# Patient Record
Sex: Male | Born: 1950
Health system: Southern US, Community
[De-identification: ages and names within clinical notes are randomized; demographics above are authoritative.]

## PROBLEM LIST (undated history)

## (undated) ENCOUNTER — Emergency Department (HOSPITAL_COMMUNITY): Payer: Self-pay

## (undated) DIAGNOSIS — Z720 Tobacco use: Secondary | ICD-10-CM

## (undated) DIAGNOSIS — I10 Essential (primary) hypertension: Secondary | ICD-10-CM

## (undated) DIAGNOSIS — E785 Hyperlipidemia, unspecified: Secondary | ICD-10-CM

## (undated) DIAGNOSIS — I2510 Atherosclerotic heart disease of native coronary artery without angina pectoris: Secondary | ICD-10-CM

## (undated) DIAGNOSIS — I639 Cerebral infarction, unspecified: Secondary | ICD-10-CM

## (undated) HISTORY — DX: Tobacco use: Z72.0

## (undated) HISTORY — DX: Essential (primary) hypertension: I10

## (undated) HISTORY — PX: OTHER SURGICAL HISTORY: SHX169

## (undated) HISTORY — DX: Hyperlipidemia, unspecified: E78.5

## (undated) HISTORY — DX: Atherosclerotic heart disease of native coronary artery without angina pectoris: I25.10

## (undated) HISTORY — DX: Cerebral infarction, unspecified: I63.9

---

## 2001-03-18 ENCOUNTER — Emergency Department (HOSPITAL_COMMUNITY): Admission: AC | Admit: 2001-03-18 | Discharge: 2001-03-19 | Payer: Self-pay

## 2001-03-18 ENCOUNTER — Encounter: Payer: Self-pay | Admitting: General Surgery

## 2001-03-19 ENCOUNTER — Encounter: Payer: Self-pay | Admitting: General Surgery

## 2001-03-31 ENCOUNTER — Encounter: Payer: Self-pay | Admitting: Family Medicine

## 2001-03-31 ENCOUNTER — Encounter: Admission: RE | Admit: 2001-03-31 | Discharge: 2001-03-31 | Payer: Self-pay | Admitting: Family Medicine

## 2001-05-19 ENCOUNTER — Encounter: Payer: Self-pay | Admitting: Sports Medicine

## 2001-05-19 ENCOUNTER — Ambulatory Visit (HOSPITAL_COMMUNITY): Admission: RE | Admit: 2001-05-19 | Discharge: 2001-05-19 | Payer: Self-pay | Admitting: Sports Medicine

## 2001-11-01 ENCOUNTER — Encounter: Payer: Self-pay | Admitting: Emergency Medicine

## 2001-11-01 ENCOUNTER — Emergency Department (HOSPITAL_COMMUNITY): Admission: EM | Admit: 2001-11-01 | Discharge: 2001-11-01 | Payer: Self-pay | Admitting: Emergency Medicine

## 2009-10-24 ENCOUNTER — Emergency Department (HOSPITAL_COMMUNITY): Admission: EM | Admit: 2009-10-24 | Discharge: 2009-10-24 | Payer: Self-pay | Admitting: Family Medicine

## 2010-05-31 ENCOUNTER — Emergency Department (HOSPITAL_COMMUNITY)
Admission: EM | Admit: 2010-05-31 | Discharge: 2010-05-31 | Payer: Self-pay | Source: Home / Self Care | Admitting: Family Medicine

## 2010-06-04 LAB — GC/CHLAMYDIA PROBE AMP, GENITAL
Chlamydia, DNA Probe: NEGATIVE
GC Probe Amp, Genital: NEGATIVE

## 2011-01-30 ENCOUNTER — Emergency Department (HOSPITAL_COMMUNITY): Payer: 59

## 2011-01-30 ENCOUNTER — Inpatient Hospital Stay (INDEPENDENT_AMBULATORY_CARE_PROVIDER_SITE_OTHER)
Admission: RE | Admit: 2011-01-30 | Discharge: 2011-01-30 | Disposition: A | Discharge status: Home / Self Care | Payer: 59 | Source: Ambulatory Visit | Attending: Family Medicine | Admitting: Family Medicine

## 2011-01-30 ENCOUNTER — Inpatient Hospital Stay (HOSPITAL_COMMUNITY)
Admission: EM | Admit: 2011-01-30 | Discharge: 2011-02-01 | DRG: 282 | Disposition: A | Discharge status: Home / Self Care | Payer: Self-pay | Source: Ambulatory Visit | Attending: Cardiology | Admitting: Cardiology

## 2011-01-30 DIAGNOSIS — Z7902 Long term (current) use of antithrombotics/antiplatelets: Secondary | ICD-10-CM

## 2011-01-30 DIAGNOSIS — Z7982 Long term (current) use of aspirin: Secondary | ICD-10-CM

## 2011-01-30 DIAGNOSIS — F17200 Nicotine dependence, unspecified, uncomplicated: Secondary | ICD-10-CM | POA: Diagnosis present

## 2011-01-30 DIAGNOSIS — I2582 Chronic total occlusion of coronary artery: Secondary | ICD-10-CM | POA: Diagnosis present

## 2011-01-30 DIAGNOSIS — R7989 Other specified abnormal findings of blood chemistry: Secondary | ICD-10-CM | POA: Diagnosis present

## 2011-01-30 DIAGNOSIS — I2510 Atherosclerotic heart disease of native coronary artery without angina pectoris: Secondary | ICD-10-CM | POA: Diagnosis present

## 2011-01-30 DIAGNOSIS — I214 Non-ST elevation (NSTEMI) myocardial infarction: Principal | ICD-10-CM | POA: Diagnosis present

## 2011-01-30 DIAGNOSIS — R079 Chest pain, unspecified: Secondary | ICD-10-CM

## 2011-01-30 DIAGNOSIS — Z79899 Other long term (current) drug therapy: Secondary | ICD-10-CM

## 2011-01-30 DIAGNOSIS — E785 Hyperlipidemia, unspecified: Secondary | ICD-10-CM | POA: Diagnosis present

## 2011-01-30 LAB — CBC
HCT: 39.5 % (ref 39.0–52.0)
Hemoglobin: 14 g/dL (ref 13.0–17.0)
MCH: 32.7 pg (ref 26.0–34.0)
MCHC: 35.4 g/dL (ref 30.0–36.0)
MCV: 92.3 fL (ref 78.0–100.0)
Platelets: 231 10*3/uL (ref 150–400)
RBC: 4.28 MIL/uL (ref 4.22–5.81)
RDW: 13.8 % (ref 11.5–15.5)
WBC: 10.8 10*3/uL — ABNORMAL HIGH (ref 4.0–10.5)

## 2011-01-30 LAB — COMPREHENSIVE METABOLIC PANEL
ALT: 58 U/L — ABNORMAL HIGH (ref 0–53)
AST: 100 U/L — ABNORMAL HIGH (ref 0–37)
Albumin: 4 g/dL (ref 3.5–5.2)
Alkaline Phosphatase: 74 U/L (ref 39–117)
BUN: 7 mg/dL (ref 6–23)
CO2: 25 mEq/L (ref 19–32)
Calcium: 9.7 mg/dL (ref 8.4–10.5)
Chloride: 100 mEq/L (ref 96–112)
Creatinine, Ser: 0.86 mg/dL (ref 0.50–1.35)
GFR calc Af Amer: >60 mL/min (ref >60)
GFR calc non Af Amer: >60 mL/min (ref >60)
Glucose, Bld: 122 mg/dL — ABNORMAL HIGH (ref 70–99)
Potassium: 3.8 mEq/L (ref 3.5–5.1)
Sodium: 136 mEq/L (ref 135–145)
Total Bilirubin: 0.5 mg/dL (ref 0.3–1.2)
Total Protein: 7.6 g/dL (ref 6.0–8.3)

## 2011-01-30 LAB — DIFFERENTIAL
Basophils Absolute: 0 10*3/uL (ref 0.0–0.1)
Basophils Relative: 0 % (ref 0–1)
Eosinophils Absolute: 0.1 10*3/uL (ref 0.0–0.7)
Eosinophils Relative: 1 % (ref 0–5)
Lymphocytes Relative: 24 % (ref 12–46)
Lymphs Abs: 2.6 10*3/uL (ref 0.7–4.0)
Monocytes Absolute: 0.7 10*3/uL (ref 0.1–1.0)
Monocytes Relative: 7 % (ref 3–12)
Neutro Abs: 7.4 10*3/uL (ref 1.7–7.7)
Neutrophils Relative %: 68 % (ref 43–77)

## 2011-01-30 LAB — POCT I-STAT TROPONIN I: Troponin i, poc: 8.88 ng/mL (ref 0.00–0.08)

## 2011-01-31 DIAGNOSIS — I2510 Atherosclerotic heart disease of native coronary artery without angina pectoris: Secondary | ICD-10-CM

## 2011-01-31 DIAGNOSIS — I219 Acute myocardial infarction, unspecified: Secondary | ICD-10-CM

## 2011-01-31 HISTORY — PX: CARDIAC CATHETERIZATION: SHX172

## 2011-01-31 LAB — APTT: aPTT: 41 seconds — ABNORMAL HIGH (ref 24–37)

## 2011-01-31 LAB — PROTIME-INR
INR: 0.92 (ref 0.00–1.49)
Prothrombin Time: 12.6 seconds (ref 11.6–15.2)

## 2011-01-31 LAB — CBC
HCT: 35.8 % — ABNORMAL LOW (ref 39.0–52.0)
Hemoglobin: 12.1 g/dL — ABNORMAL LOW (ref 13.0–17.0)
MCH: 31.3 pg (ref 26.0–34.0)
MCHC: 33.8 g/dL (ref 30.0–36.0)
MCV: 92.7 fL (ref 78.0–100.0)
Platelets: 223 10*3/uL (ref 150–400)
RBC: 3.86 MIL/uL — ABNORMAL LOW (ref 4.22–5.81)
RDW: 14.2 % (ref 11.5–15.5)
WBC: 10.1 10*3/uL (ref 4.0–10.5)

## 2011-01-31 LAB — LIPID PANEL
Cholesterol: 213 mg/dL — ABNORMAL HIGH (ref 0–200)
HDL: 42 mg/dL (ref >39)
LDL Cholesterol: 111 mg/dL — ABNORMAL HIGH (ref 0–99)
Total CHOL/HDL Ratio: 5.1 RATIO
Triglycerides: 302 mg/dL — ABNORMAL HIGH (ref <150)
VLDL: 60 mg/dL — ABNORMAL HIGH (ref 0–40)

## 2011-01-31 LAB — HEPARIN LEVEL (UNFRACTIONATED): Heparin Unfractionated: 0.16 IU/mL — ABNORMAL LOW (ref 0.30–0.70)

## 2011-01-31 LAB — CK TOTAL AND CKMB (NOT AT ARMC)
CK, MB: 57.6 ng/mL (ref 0.3–4.0)
Relative Index: 9.3 — ABNORMAL HIGH (ref 0.0–2.5)
Total CK: 622 U/L — ABNORMAL HIGH (ref 7–232)

## 2011-01-31 LAB — CARDIAC PANEL(CRET KIN+CKTOT+MB+TROPI)
CK, MB: 34.4 ng/mL (ref 0.3–4.0)
CK, MB: 20.4 ng/mL (ref 0.3–4.0)
Relative Index: 7.1 — ABNORMAL HIGH (ref 0.0–2.5)
Total CK: 484 U/L — ABNORMAL HIGH (ref 7–232)
Total CK: 337 U/L — ABNORMAL HIGH (ref 7–232)
Troponin I: 10.83 ng/mL (ref <0.30)

## 2011-01-31 LAB — HEMOGLOBIN A1C
Hgb A1c MFr Bld: 5.1 % (ref <5.7)
Mean Plasma Glucose: 100 mg/dL (ref <117)

## 2011-01-31 LAB — MAGNESIUM: Magnesium: 2.2 mg/dL (ref 1.5–2.5)

## 2011-01-31 LAB — TSH: TSH: 0.412 u[IU]/mL (ref 0.350–4.500)

## 2011-01-31 LAB — RAPID URINE DRUG SCREEN, HOSP PERFORMED
Barbiturates: NOT DETECTED
Opiates: POSITIVE — AB
Tetrahydrocannabinol: NOT DETECTED

## 2011-01-31 LAB — TROPONIN I: Troponin I: 7.09 ng/mL (ref <0.30)

## 2011-01-31 LAB — MRSA PCR SCREENING: MRSA by PCR: NEGATIVE

## 2011-01-31 LAB — PRO B NATRIURETIC PEPTIDE: Pro B Natriuretic peptide (BNP): 207 pg/mL — ABNORMAL HIGH (ref 0–125)

## 2011-02-01 DIAGNOSIS — I214 Non-ST elevation (NSTEMI) myocardial infarction: Secondary | ICD-10-CM

## 2011-02-01 LAB — BASIC METABOLIC PANEL
CO2: 26 mEq/L (ref 19–32)
Glucose, Bld: 106 mg/dL — ABNORMAL HIGH (ref 70–99)
Potassium: 3.7 mEq/L (ref 3.5–5.1)
Sodium: 134 mEq/L — ABNORMAL LOW (ref 135–145)

## 2011-02-01 LAB — CBC
Hemoglobin: 12.2 g/dL — ABNORMAL LOW (ref 13.0–17.0)
RBC: 3.84 MIL/uL — ABNORMAL LOW (ref 4.22–5.81)

## 2011-02-02 NOTE — H&P (Signed)
[name], [name] NO.:  [id number]  MEDICAL RECORD NO.:  [id number]  LOCATION:  2908                         FACILITY:  [facility]  PHYSICIAN:  Lenon Oms, MD  DATE OF BIRTH:  09-07-1950  DATE OF ADMISSION:  01/30/2011 DATE OF DISCHARGE:                             HISTORY & PHYSICAL   CARDIOLOGIST:  None.  PRIMARY CARE PHYSICIAN:  None.  CHIEF COMPLAINT:  Chest pain.  HISTORY OF PRESENT ILLNESS:  Dennis Harmon is a 60 year old gentleman with no prior history of MI and no known coronary artery disease who presents to the emergency department with chief complaint of chest pain. The patient stated that his chest pain began 1 day prior, it was sudden onset and midsternal chest pain, rating 10/10.  The patient stated his chest pain started at around 5:00 p.m.  The patient took some Tylenol and did have some relief where his chest pain went down to about 5/10. The patient went to sleep, next day the patient awoke still with chest pain.  He also reported his chest pain radiated to his left arm and was also associated with diaphoresis.  The patient did feel lethargic and weak and did not go to work.  Today, he presented to urgent care facility who sent the patient here for further evaluation and management.  The patient was given three nitroglycerin and is currently chest pain free upon the evaluation here in the emergency department.  PAST MEDICAL HISTORY:  The patient denied any history of hypertension, diabetes, high cholesterol.  The patient has not had no prior cardiac stress test or cardiac catheterization.  He has only had surgery for a right thumb ligament.  ALLERGIES:  No known drug allergies.  MEDICATIONS:  He is not currently on any prescribed or over-the-counter medications.  SOCIAL HISTORY:  The patient lives in [place] with his mother.  He is a Naval architect.  He does smoke one and a half packs per day for the last 30 years.  He  drinks approximately 2 beers a day.  He denies any illicit drug use.  FAMILY HISTORY:  He stated that he had uncle with CVA and coronary artery disease.  REVIEW OF SYSTEMS:  Recently the patient reported chills, nonproductive cough.  He denied any melena.  All other systems were reviewed and are otherwise negative.  PHYSICAL EXAMINATION:  VITAL SIGNS:  Temperature 98.7, pulse 94, respiratory rate 22, blood pressure 114/64. GENERAL:  He is pleasant African American gentleman, in no acute distress. HEENT:  Normocephalic, atraumatic.  Pupils equally round and reactive to light.  Extraocular movements intact.  Anicteric sclerae. NECK:  Supple.  No JVD. HEART:  Regular rate and rhythm.  Normal S1 and S2. LUNGS:  Clear to auscultation bilaterally. ABDOMEN:  Soft and nontender.  Normoactive bowel sounds. NEUROLOGIC:  The patient was awake, alert, and oriented x3. EXTREMITIES:  No cyanosis, clubbing, or edema.  RADIOLOGY: 1. The patient had a chest x-ray which revealed no acute     cardiopulmonary abnormality. 2. The patient had an EKG and initially revealed sinus rhythm, heart     rate of 94 with PVCs.  There are nonspecific  T-wave changes.     Followup EKG, sinus rhythm, heart rate of 93, again with     nonspecific T-wave changes.  The patient has no prior EKGs in our     system for review or comparison.  LABORATORY DATA:  The patient had a CBC profile which revealed a white count of 10.8, hemoglobin of 14.0, hematocrit of 39.5, platelets of 231. The patient had a comprehensive metabolic profile which revealed a sodium of 138, potassium of 3.8, BUN of 7, creatinine of 0.8, AST was 100, ALT was 58, alk phos was 74.  The patient had a magnesium of 2.2. A proBNP was 207.  INR was 0.92.  Initial troponin was 8.88, followup troponin was 7.09.  ASSESSMENT:  Dennis Harmon is a 60 year old gentleman with past medical history of tobacco abuse, however, denies any prior history  of myocardial infarction or known coronary artery disease who presents to emergency department here with chief complaint of chest pain x2 days with troponin elevation of 8.88 with nonspecific T-wave changes on EKG. The patient will be admitted to [facility] with non-ST-segment elevation myocardial infarction.  The patient will be admitted to the intensive care unit.  We will continue to monitor serial biomarkers.  We will repeat EKG in a.m. or p.r.n. for any chest pain.  We will make n.p.o. at midnight.  The patient will likely benefit from left heart catheterization and possible percutaneous coronary intervention and medical management.  The patient has received aspirin and Plavix while in emergency department.  We will continue with aspirin, Plavix, also heparin drip, beta-blocker, and statin, and nitroglycerin p.r.n.  We will check a lipid profile, hemoglobin A1c, and TSH.  I discussed initial workup thus far with the patient in detail and also discussed plan.  I have answered all his questions at this time.          ______________________________ Lenon Oms, MD     PB/MEDQ  D:  01/31/2011  T:  01/31/2011  Job:  [id number]  Electronically Signed by Lenon Oms MD on 02/02/2011 05:59:19 PM

## 2011-02-03 NOTE — Discharge Summary (Addendum)
NAMEKAILAND, Harmon NO.:  [id number]  MEDICAL RECORD NO.:  [id number]  LOCATION:  2501                         FACILITY:  [facility]  PHYSICIAN:  Marca Ancona, MD      DATE OF BIRTH:  05-28-1950  DATE OF ADMISSION:  01/30/2011 DATE OF DISCHARGE:  02/01/2011                              DISCHARGE SUMMARY   DISCHARGE DIAGNOSES: 1. Non-ST-elevation myocardial infarction. 2. Newly diagnosed coronary artery disease by catheterization on     January 31, 2011, with subtotally occluded second obtuse marginal     and diffuse segmental plaque involving the mid circumflex and right     coronary artery, for medical therapy and deferments of percutaneous     coronary intervention for recurrent ischemia on medical regimen. 3. Normal left ventricular function with the ejection fraction of 55%     by echocardiogram on January 31, 2011. 4. Mildly elevated LFTs with AST of 100, ALT of 58 with regular     alcohol use, instructed to discontinue alcohol. 5. Dyslipidemia.  Total cholesterol 213, triglycerides 302, HDL 42,     LDL 111. 6. Tobacco abuse.  HOSPITAL COURSE:  [name] is a 60 year old gentleman with no prior pertinent past medical history, presented to the ED with complaints of chest pain, that began 1 day prior to admission.  He took some Tylenol and did have relief and his chest pain went down to a 5/10.  However, the patient awoke the following morning still with chest pain radiating to his left arm associated with diaphoresis.  He presented to urgent care and was subsequently promptly referred to the [facility] ER.  Upon evaluation by Cardiology, he was chest pain free.  EKG demonstrated sinus rhythm, heart rate of 94.  PVCs with nonspecific T-wave changes. He was admitted to the hospital for rule out and initial cardiac markers did demonstrate that he was having NSTEMI with a peak troponin of 10.83. He was started on aspirin, beta-blocker, and statin and  referred for catheterization which was performed on January 31, 2011, by Dr. Riley Kill.  Dr. Riley Kill found a subtotally occluded second OM branch with TIMI 1 flow.  Given his enzymes and history of onset of pain, he was felt to be more than likely well out to be on 24 hours, was likely a total occlusion of that OM.  Dr. Riley Kill recommended to defer PCI for recurrent ischemia on his medication regimen.  He was instructed to discontinue smoking.  His LFTs were noted to be mildly elevated on admission and Dr. Shirlee Latch has also instructed him to cut down on his alcohol use.  This will need to be rechecked as an outpatient.  On the day of discharge, the patient is feeling well.  He does have a low-grade temperature of 100 without obvious source of infection or complaints. Dr. Shirlee Latch has seen and examined and feels he is stable for discharge.  DISCHARGE LABORATORY DATA:  WBC 10, hemoglobin 12.2, hematocrit 35.7, platelet count 214.  Sodium 136, potassium 2.8, chloride 100, CO2 of 25, glucose 122, BUN 7, creatinine 0.86.  Total bilirubin 0.5, alk phos 74, AST 100, ALT 58, magnesium 2.2.  A1c 5.1.  Peak troponin 10.83. Cholesterol panel as above.  TSH 0.412.  STUDIES: 1. Chest x-ray on January 30, 2011, shows mildly low lung volumes     without acute cardiopulmonary abnormality. 2. Cardiac catheterization on January 31, 2011, please see above for     summary as well as full report for details. 3. A 2-D echocardiogram on January 31, 2011, demonstrated mild LVH.     EF 55%.  Basal mid anterolateral mild hypokinesis.  Grade 1     diastolic dysfunction.  PA systolic pressure 27-31 mmHg.  DISCHARGE MEDICATIONS: 1. Aspirin 81 mg. 2. Plavix 75 mg daily. 3. Lipitor 40 mg at bedtime. 4. Lisinopril 5 mg daily. 5. Lopressor 25 mg b.i.d. 6. Nitroglycerin sublingual 0.4 mg every 5 minutes as needed up to 3     doses for chest pain.  DISPOSITION:  Mr. [name] will be discharged in stable condition  to home. He is instructed not to return to work until February 18, 2011, to his job as a Naval architect.  He is to follow a low-sodium, heart-healthy diet and to return if he notices any pain, swelling, bleeding, or pus at his cath site.  Dr. Alford Highland office will call her for his followup appointment in the next 2-3 weeks.  We will also have the patient be seen by Cardiac Rehab prior to discharge with plans for arrangement of her outpatient cardiac rehab.  DURATION OF DISCHARGE ENCOUNTER:  Greater than 30 minutes including physician and PA time.     Ronie Spies, P.A.C.   ______________________________ Marca Ancona, MD    DD/MEDQ  D:  02/01/2011  T:  02/01/2011  Job:  [id number]  Electronically Signed by Marca Ancona MD on 02/03/2011 12:08:42 AM Electronically Signed by Ronie Spies  on 02/09/2011 08:53:13 AM

## 2011-02-15 ENCOUNTER — Encounter: Payer: Self-pay | Admitting: Physician Assistant

## 2011-02-18 ENCOUNTER — Encounter: Payer: Self-pay | Admitting: Physician Assistant

## 2011-02-18 ENCOUNTER — Ambulatory Visit (INDEPENDENT_AMBULATORY_CARE_PROVIDER_SITE_OTHER): Payer: Self-pay | Admitting: Physician Assistant

## 2011-02-18 ENCOUNTER — Encounter: Payer: Self-pay | Admitting: *Deleted

## 2011-02-18 VITALS — BP 146/82 | HR 85 | Ht 66.0 in | Wt 186.0 lb

## 2011-02-18 DIAGNOSIS — Z720 Tobacco use: Secondary | ICD-10-CM

## 2011-02-18 DIAGNOSIS — I2510 Atherosclerotic heart disease of native coronary artery without angina pectoris: Secondary | ICD-10-CM | POA: Insufficient documentation

## 2011-02-18 DIAGNOSIS — E785 Hyperlipidemia, unspecified: Secondary | ICD-10-CM

## 2011-02-18 DIAGNOSIS — F17200 Nicotine dependence, unspecified, uncomplicated: Secondary | ICD-10-CM

## 2011-02-18 DIAGNOSIS — I209 Angina pectoris, unspecified: Secondary | ICD-10-CM

## 2011-02-18 DIAGNOSIS — I10 Essential (primary) hypertension: Secondary | ICD-10-CM

## 2011-02-18 DIAGNOSIS — I208 Other forms of angina pectoris: Secondary | ICD-10-CM | POA: Insufficient documentation

## 2011-02-18 LAB — BASIC METABOLIC PANEL
BUN: 11 mg/dL (ref 6–23)
Chloride: 103 mEq/L (ref 96–112)
Glucose, Bld: 117 mg/dL — ABNORMAL HIGH (ref 70–99)
Potassium: 4 mEq/L (ref 3.5–5.1)

## 2011-02-18 LAB — HEPATIC FUNCTION PANEL
ALT: 36 U/L (ref 0–53)
AST: 25 U/L (ref 0–37)
Albumin: 4.3 g/dL (ref 3.5–5.2)
Total Bilirubin: 0.5 mg/dL (ref 0.3–1.2)

## 2011-02-18 MED ORDER — METOPROLOL TARTRATE 25 MG PO TABS: 50.0000 mg | ORAL_TABLET | Freq: Two times a day (BID) | ORAL | Status: DC

## 2011-02-18 NOTE — Assessment & Plan Note (Signed)
LFTs were elevated in the hospital.  These will be repeated today.  Check lipids and LFTs again in 3 months.

## 2011-02-18 NOTE — Assessment & Plan Note (Addendum)
He has not had his medicines yet today.  We'll increase metoprolol as noted.  Lisinopril was new for him.  Basic metabolic panel will be checked today.

## 2011-02-18 NOTE — Assessment & Plan Note (Signed)
He has had 2 episodes of exertional angina since discharge from the hospital.  I will have him increase his metoprolol for antianginal effect to 50 mg twice a day.  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  I will keep him out of work until he returns for followup.  He can see either Dr. Shirlee Latch or me in 2-3 weeks.

## 2011-02-18 NOTE — Patient Instructions (Addendum)
Your physician recommends that you schedule a follow-up appointment in: 03/13/11 @ 2:00 PM TO SEE SCOTT WEAVER, PA-C   Your physician recommends that you return for lab work in: TODAY BMET 401.1 HTN, 414.01 CAD, LFT 272.4 HYPERLIPIDEMIA  Your physician recommends that you return for lab work in: 05/22/11 FOR A FASTING LIVER/LIPID PANEL 272.4 HYPERLIPIDEMIA  You have been referred to CARDIAC REHAB 414.01 CAD, 401.1 HTN, 272.4 HYPERLIPIDEMIA  Your physician has recommended you make the following change in your medication: METOPROLOL TARTRATE 50 MG TWICE DAILY  PER SCOTT WEAVER, PA-C IT IS OK TO TAKE A MULTIVITAMIN ANY BRAND IS OK

## 2011-02-18 NOTE — Assessment & Plan Note (Addendum)
Continue current medical therapy.  Continue aspirin and Plavix.  Increase metoprolol as noted.  Refer to cardiac rehabilitation.  If he fails medical therapy, consider PCI as noted by Dr. Riley Kill.

## 2011-02-18 NOTE — Progress Notes (Signed)
History of Present Illness: Primary Cardiologist:  Dr. Marca Ancona   Dennis Harmon is a 60 y.o. male who presents for post hospital follow up.  He was admitted 9/12-9/14.  He presented with chest pain and left arm pain.  He ruled in for MI with peak TnI 10.83.  Cardiac cath 01/31/11: EF 60%, Inf HK, mRCA 30-40%, LAD ok, mCFX 50%, OM2 occluded (likely > 24hr with Timi 1 flow).  Medical Tx was recommended and PCI would be deferred for recurrent ischemia.  Echo 01/31/11: mild LVH, EF 55%, AL HK, grade 1 diast dysfxn, PASP 27-31.  Pertinent labs:  Hgb 12.2, K 3.7, creatinine 0.97, ALT 58, BNP 207, TC 213, TG 302, HDL 42, LDL 111, TSH 0.412.  CXR was unremarkable.    Overall, he is doing well.  He is walking on a daily basis.  He has had 2 episodes of chest tightness with exertion.  He took nitroglycerin each time with resolution after one tablet.  His symptoms were not as severe as his presenting symptoms.  He has been able to exert himself without chest discomfort.  He did notice some slight shortness of breath with this.  Otherwise, he denies significant dyspnea with exertion.  He denies orthopnea, PND or edema.  He denies syncope.  He is still smoking.  Past Medical History  Diagnosis Date  . Coronary artery disease     NSTEMI 9/12:  cath 01/31/11: EF 60%, Inf HK, mRCA 30-40%, LAD ok, mCFX 50%, OM2 occluded (likely > 24hr with Timi 1 flow).  Medical Tx was recommended and PCI would be deferred for recurrent ischemia.  Echo 01/31/11: mild LVH, EF 55%, AL HK, grade 1 diast dysfxn, PASP 27-31  . Hyperlipidemia   . Tobacco abuse   . HTN (hypertension)     Current Outpatient Prescriptions  Medication Sig Dispense Refill  . aspirin 81 MG tablet Take 81 mg by mouth daily.        Marland Kitchen atorvastatin (LIPITOR) 40 MG tablet Take 40 mg by mouth daily. At bedtime       . clopidogrel (PLAVIX) 75 MG tablet Take 75 mg by mouth daily.        Marland Kitchen lisinopril (PRINIVIL,ZESTRIL) 5 MG tablet Take 5 mg by mouth daily.        .  metoprolol tartrate (LOPRESSOR) 25 MG tablet Take 2 tablets (50 mg total) by mouth 2 (two) times daily.  11 tablet  6  . nitroGLYCERIN (NITRODUR - DOSED IN MG/24 HR) 0.4 mg/hr Place 1 patch onto the skin daily.        Marland Kitchen DISCONTD: metoprolol tartrate (LOPRESSOR) 25 MG tablet Take 25 mg by mouth 2 (two) times daily.          Allergies: No Known Allergies  Social history:  Smoking 4-5 cigarettes per day  Vital Signs: BP 146/82  Pulse 85  Ht 5\' 6"  (1.676 m)  Wt 186 lb (84.369 kg)  BMI 30.02 kg/m2  PHYSICAL EXAM: Well nourished, well developed, in no acute distress HEENT: normal Neck: no JVD Cardiac:  normal S1, S2; RRR; no murmur Lungs:  Decreased breath sounds bilaterally, no wheezing, rhonchi or rales Abd: soft, nontender, no hepatomegaly Ext: no edema; Right radial site without hematoma or bruit Skin: warm and dry Neuro:  CNs 2-12 intact, no focal abnormalities noted Psych: Normal affect  EKG:  Sinus rhythm, heart rate 85, normal axis, nonspecific ST-T wave changes  ASSESSMENT AND PLAN:

## 2011-02-18 NOTE — Assessment & Plan Note (Signed)
He is trying to quit.  

## 2011-02-19 NOTE — Progress Notes (Signed)
Agree with above note.  In addition, I would have him take Imdur 60 mg daily instead of the nitroglycerine patch.  Followup with me in 2-3 weeks.  Chardae Mulkern [employer]

## 2011-02-20 ENCOUNTER — Telehealth: Payer: Self-pay | Admitting: Physician Assistant

## 2011-02-20 NOTE — Telephone Encounter (Signed)
Pt returning call from someone from this office. Please return pt call to discuss further.

## 2011-02-20 NOTE — Telephone Encounter (Signed)
Pt is aware of lab results Debbie Malaiah Viramontes RN  

## 2011-02-20 NOTE — Progress Notes (Signed)
LMTCB

## 2011-02-21 ENCOUNTER — Other Ambulatory Visit: Payer: Self-pay | Admitting: *Deleted

## 2011-02-21 MED ORDER — ISOSORBIDE MONONITRATE ER 30 MG PO TB24: 30.0000 mg | ORAL_TABLET | Freq: Every day | ORAL | Status: DC

## 2011-02-21 NOTE — Progress Notes (Signed)
I talked with pt. Pt is not currently using a nitroglycerin patch. I reviewed with Dr Shirlee Latch. He recommended pt start imdur 30mg  daily instead of imdur 60mg  daily.

## 2011-02-28 NOTE — Cardiovascular Report (Signed)
NAMEKANO, HECKMANN NO.:  [id number]  MEDICAL RECORD NO.:  [id number]  LOCATION:  2807                         FACILITY:  [facility]  PHYSICIAN:  Arturo Morton. Riley Kill, MD, FACCDATE OF BIRTH:  1950/06/10  DATE OF PROCEDURE:  01/31/2011 DATE OF DISCHARGE:                           CARDIAC CATHETERIZATION   INDICATIONS:  [name] is a 60 year old gentleman who presents with substernal chest pain.  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  He developed chest pain on Tuesday which was somewhat prolonged.  He eventually came in seeking attention. He has been pain-free since yesterday.  His CK-MBs are 57 and troponin is 8 and has not risen much.  The current study was done to assess his coronary anatomy.  PROCEDURE: 1. Left heart catheterization. 2. Selective coronary arteriography. 3. Selective left ventriculography.  DESCRIPTION OF PROCEDURE:  The procedure was performed from the right radial artery after access, 3 mg of intra-arterial verapamil and 4000 units of intravenous heparin were administered.  The Allen's test was appropriate prior to insertion.  Following this, views of the left and right coronary arteries were obtained in multiple angiographic projections.  Central aortic left ventricular pressures were measured with pigtail. Ventriculography was performed in both the RAO and LAO views.  There were no major complications after review the findings, given the patient's prolonged chest pain on Tuesday, and positive enzymes, and TIMI 1 flow.  It was felt that an initial trial of medical therapy was most appropriate treatment.  There were no major complications.  A TR band was placed and he was taken to the holding area in satisfactory clinical condition.  HEMODYNAMIC DATA: 1. Initial central aortic pressure was 97/61, mean 77. 2. LV pressure 113/9. 3. There was no gradient or pullback across the aortic  valve.  ANGIOGRAPHIC DATA: 1. Ventriculography was performed in the RAO and LAO projections.     There may be a very small area of hypokinesis in the midportion of     the inferior wall, but is not substantial and ejection fraction is     well in excess of 60%.  Both RAO and LAO ventriculograms were     performed. 2. The right coronary artery is calcified in its midportion.  There is     probably segmental plaque that measures around 30%-40% luminal     reduction.  There is a large posterior descending branch with sub-     branches in a large posterolateral system.  All without critical     narrowing. 3. The left main is free of critical disease. 4. The left anterior descending artery courses to the apex.  There is     a proximal diagonal branch that is moderately large with mild     luminal irregularity, but no significant critical stenoses.  The     remainder of the LAD provides a septal and a modest sized diagonal     and there is minimal irregularity, but no significant focal areas     of obstruction. 5. The circumflex coronary artery has a large 180 degrees turn coming  out of the left main.  The midportion of the vessel has     calcification and diffuse 50% plaque throughout the midportion of     the vessel overlapping the takeoff of the first marginal and really     the second marginal branch.  This leads into the large third     marginal branch.  The first marginal branch is small and without     significant narrowing.  The second marginal branch is subtotally     occluded and provides a single branch that is just small-to-     moderate size and demonstrates TIMI zero one flow with incomplete     opacification throughout the cardiac cycle.  The distal large     marginal is free of critical disease.  CONCLUSION: 1. Preserved overall left ventricular systolic function. 2. Subtotal occlusion of the second obtuse marginal branch, with TIMI     zero one flow and a pain-free  environment. 3. Positive cardiac markers consistent with recent infarction. 4. Diffuse segmental plaque involving the mid circumflex and right     coronary artery.  DISPOSITION: 1. The patient will be treated medically with percutaneous coronary     intervention reserved for recurrent ischemia.  Given his cardiac     enzymes, and his historical onset of pain, he is more than likely     well out beyond 24 hours from what likely is a total occlusion of     the second obtuse marginal.  There is barely TIMI 1 flow at this     point in time.  The patient is not having chest pain.  We would     defer percutaneous coronary intervention for recurrent ischemia on     medical regimen. 2. Discontinuation of cigarette smoking would be recommended. 3. Medical therapy.  Cardiac rehabilitation would be helpful in this     gentleman and we will make arrangements for such.     Arturo Morton. Riley Kill, MD, [facility]     TDS/MEDQ  D:  01/31/2011  T:  01/31/2011  Job:  [id number]  cc:   CV laboratory  Electronically Signed by Shawnie Pons MD [facility] on 02/28/2011 05:42:29 AM

## 2011-03-04 ENCOUNTER — Encounter (HOSPITAL_COMMUNITY)
Admission: RE | Admit: 2011-03-04 | Discharge: 2011-03-04 | Disposition: A | Discharge status: Home / Self Care | Payer: Self-pay | Source: Ambulatory Visit | Attending: Cardiology | Admitting: Cardiology

## 2011-03-04 DIAGNOSIS — E785 Hyperlipidemia, unspecified: Secondary | ICD-10-CM | POA: Insufficient documentation

## 2011-03-04 DIAGNOSIS — F17200 Nicotine dependence, unspecified, uncomplicated: Secondary | ICD-10-CM | POA: Insufficient documentation

## 2011-03-04 DIAGNOSIS — I2582 Chronic total occlusion of coronary artery: Secondary | ICD-10-CM | POA: Insufficient documentation

## 2011-03-04 DIAGNOSIS — Z79899 Other long term (current) drug therapy: Secondary | ICD-10-CM | POA: Insufficient documentation

## 2011-03-04 DIAGNOSIS — Z5189 Encounter for other specified aftercare: Secondary | ICD-10-CM | POA: Insufficient documentation

## 2011-03-04 DIAGNOSIS — I214 Non-ST elevation (NSTEMI) myocardial infarction: Secondary | ICD-10-CM | POA: Insufficient documentation

## 2011-03-04 DIAGNOSIS — Z7982 Long term (current) use of aspirin: Secondary | ICD-10-CM | POA: Insufficient documentation

## 2011-03-04 DIAGNOSIS — Z7902 Long term (current) use of antithrombotics/antiplatelets: Secondary | ICD-10-CM | POA: Insufficient documentation

## 2011-03-04 DIAGNOSIS — I2510 Atherosclerotic heart disease of native coronary artery without angina pectoris: Secondary | ICD-10-CM | POA: Insufficient documentation

## 2011-03-06 ENCOUNTER — Encounter (HOSPITAL_COMMUNITY): Payer: Self-pay

## 2011-03-08 ENCOUNTER — Encounter (HOSPITAL_COMMUNITY): Payer: Self-pay

## 2011-03-11 ENCOUNTER — Encounter (HOSPITAL_COMMUNITY): Payer: Self-pay

## 2011-03-13 ENCOUNTER — Ambulatory Visit: Payer: Self-pay | Admitting: Physician Assistant

## 2011-03-13 ENCOUNTER — Encounter: Payer: Self-pay | Admitting: Physician Assistant

## 2011-03-13 ENCOUNTER — Encounter: Payer: Self-pay | Admitting: *Deleted

## 2011-03-13 ENCOUNTER — Encounter (HOSPITAL_COMMUNITY): Payer: Self-pay

## 2011-03-13 ENCOUNTER — Ambulatory Visit (INDEPENDENT_AMBULATORY_CARE_PROVIDER_SITE_OTHER): Payer: Self-pay | Admitting: Physician Assistant

## 2011-03-13 DIAGNOSIS — F17200 Nicotine dependence, unspecified, uncomplicated: Secondary | ICD-10-CM

## 2011-03-13 DIAGNOSIS — I208 Other forms of angina pectoris: Secondary | ICD-10-CM

## 2011-03-13 DIAGNOSIS — Z720 Tobacco use: Secondary | ICD-10-CM

## 2011-03-13 DIAGNOSIS — I10 Essential (primary) hypertension: Secondary | ICD-10-CM

## 2011-03-13 DIAGNOSIS — I2510 Atherosclerotic heart disease of native coronary artery without angina pectoris: Secondary | ICD-10-CM

## 2011-03-13 DIAGNOSIS — I209 Angina pectoris, unspecified: Secondary | ICD-10-CM

## 2011-03-13 DIAGNOSIS — I2089 Other forms of angina pectoris: Secondary | ICD-10-CM

## 2011-03-13 DIAGNOSIS — E785 Hyperlipidemia, unspecified: Secondary | ICD-10-CM

## 2011-03-13 NOTE — Patient Instructions (Signed)
Your physician recommends that you schedule a follow-up appointment in: 3 months with Dr McLean  

## 2011-03-13 NOTE — Assessment & Plan Note (Signed)
We discussed different strategies for quitting.  We discussed Chantix and possible side effects.  He will think about it.  We also discussed setting a quit date, nicotine patches and e-cigs.

## 2011-03-13 NOTE — Progress Notes (Signed)
History of Present Illness: Primary Cardiologist:  Dr. Marca Ancona   Dennis Harmon is a 60 y.o. male who presents for follow up.  Admitted 9/12 with chest pain and left arm pain.  He ruled in for NSTEMI.  LHC 01/31/11: EF 60%, Inf HK, mRCA 30-40%, LAD ok, mCFX 50%, OM2 occluded (likely > 24hr with Timi 1 flow).  Medical Tx was recommended and PCI would be deferred for recurrent ischemia.  Echo 01/31/11: mild LVH, EF 55%, AL HK, grade 1 diast dysfxn, PASP 27-31.    I saw him 10/1 in follow up.  He had 2 episodes of angina since hospital discharge and I increased his metoprolol.  We also put him on Imdur after consultation with Dr. Shirlee Latch.  Of note, he was not on Nitropatch previously.  He is doing well.  The patient denies chest pain, shortness of breath, syncope, orthopnea, PND or significant pedal edema.   Labs (10/12): K 4, creatinine 1.0, ALT 36   Past Medical History  Diagnosis Date  . Coronary artery disease     NSTEMI 9/12:  cath 01/31/11: EF 60%, Inf HK, mRCA 30-40%, LAD ok, mCFX 50%, OM2 occluded (likely > 24hr with Timi 1 flow).  Medical Tx was recommended and PCI would be deferred for recurrent ischemia.  Echo 01/31/11: mild LVH, EF 55%, AL HK, grade 1 diast dysfxn, PASP 27-31  . Hyperlipidemia   . Tobacco abuse   . HTN (hypertension)     Current Outpatient Prescriptions  Medication Sig Dispense Refill  . aspirin 81 MG tablet Take 81 mg by mouth daily.        Marland Kitchen atorvastatin (LIPITOR) 40 MG tablet Take 40 mg by mouth daily. At bedtime       . clopidogrel (PLAVIX) 75 MG tablet Take 75 mg by mouth daily.        . isosorbide mononitrate (IMDUR) 30 MG 24 hr tablet Take 1 tablet (30 mg total) by mouth daily.  30 tablet  6  . lisinopril (PRINIVIL,ZESTRIL) 5 MG tablet Take 5 mg by mouth daily.        . metoprolol tartrate (LOPRESSOR) 25 MG tablet Take 2 tablets (50 mg total) by mouth 2 (two) times daily.  11 tablet  6  . nitroGLYCERIN (NITRODUR - DOSED IN MG/24 HR) 0.4 mg/hr Place 1 patch  onto the skin daily.          Allergies: No Known Allergies  Social history:  Still smoking 4-5 cigarettes per day  Vital Signs: BP 160/90  Pulse 72  Resp 18  Ht 5\' 6"  (1.676 m)  Wt 187 lb (84.823 kg)  BMI 30.18 kg/m2  Repeat BP by me on manual cuff both arms: BP 128/80  Pulse 72  Resp 18  Ht 5\' 6"  (1.676 m)  Wt 187 lb (84.823 kg)  BMI 30.18 kg/m2   PHYSICAL EXAM: Well nourished, well developed, in no acute distress HEENT: normal Neck: no JVD Cardiac:  normal S1, S2; RRR; no murmur Lungs:  Decreased breath sounds bilaterally, no wheezing, rhonchi or rales Abd: soft, nontender, no hepatomegaly Ext: no edema Skin: warm and dry Neuro:  CNs 2-12 intact, no focal abnormalities noted Psych: Normal affect  ASSESSMENT AND PLAN:

## 2011-03-13 NOTE — Assessment & Plan Note (Signed)
Continue Lipitor.  Check L/L in 2 months

## 2011-03-13 NOTE — Assessment & Plan Note (Signed)
Resolved on current treatment.  No change in therapy.

## 2011-03-13 NOTE — Assessment & Plan Note (Addendum)
Continue ASA, Plavix and statin.  I gave him a note today to return to work.

## 2011-03-13 NOTE — Assessment & Plan Note (Signed)
Controlled.  

## 2011-03-14 ENCOUNTER — Other Ambulatory Visit: Payer: Self-pay | Admitting: Physician Assistant

## 2011-03-15 ENCOUNTER — Encounter (HOSPITAL_COMMUNITY): Payer: Self-pay

## 2011-03-18 ENCOUNTER — Encounter (HOSPITAL_COMMUNITY): Payer: Self-pay

## 2011-03-20 ENCOUNTER — Encounter (HOSPITAL_COMMUNITY): Payer: Self-pay

## 2011-03-22 ENCOUNTER — Encounter (HOSPITAL_COMMUNITY): Payer: Self-pay

## 2011-03-25 ENCOUNTER — Encounter (HOSPITAL_COMMUNITY): Payer: Self-pay

## 2011-03-27 ENCOUNTER — Encounter (HOSPITAL_COMMUNITY): Payer: Self-pay

## 2011-03-29 ENCOUNTER — Encounter (HOSPITAL_COMMUNITY): Payer: Self-pay

## 2011-04-01 ENCOUNTER — Encounter (HOSPITAL_COMMUNITY): Payer: Self-pay

## 2011-04-03 ENCOUNTER — Encounter (HOSPITAL_COMMUNITY): Payer: Self-pay

## 2011-04-05 ENCOUNTER — Encounter (HOSPITAL_COMMUNITY): Payer: Self-pay

## 2011-04-08 ENCOUNTER — Encounter (HOSPITAL_COMMUNITY): Payer: Self-pay

## 2011-04-10 ENCOUNTER — Encounter (HOSPITAL_COMMUNITY): Payer: Self-pay

## 2011-04-12 ENCOUNTER — Encounter (HOSPITAL_COMMUNITY): Payer: Self-pay

## 2011-04-15 ENCOUNTER — Encounter (HOSPITAL_COMMUNITY): Payer: Self-pay

## 2011-04-17 ENCOUNTER — Encounter (HOSPITAL_COMMUNITY): Payer: Self-pay

## 2011-04-19 ENCOUNTER — Encounter (HOSPITAL_COMMUNITY): Payer: Self-pay

## 2011-04-22 ENCOUNTER — Encounter (HOSPITAL_COMMUNITY): Payer: Self-pay

## 2011-04-24 ENCOUNTER — Encounter (HOSPITAL_COMMUNITY): Payer: Self-pay

## 2011-04-26 ENCOUNTER — Encounter (HOSPITAL_COMMUNITY): Payer: Self-pay

## 2011-04-29 ENCOUNTER — Encounter (HOSPITAL_COMMUNITY): Payer: Self-pay

## 2011-05-01 ENCOUNTER — Encounter (HOSPITAL_COMMUNITY): Payer: Self-pay

## 2011-05-03 ENCOUNTER — Encounter (HOSPITAL_COMMUNITY): Payer: Self-pay

## 2011-05-06 ENCOUNTER — Encounter (HOSPITAL_COMMUNITY): Payer: Self-pay

## 2011-05-08 ENCOUNTER — Encounter (HOSPITAL_COMMUNITY): Payer: Self-pay

## 2011-05-10 ENCOUNTER — Encounter (HOSPITAL_COMMUNITY): Payer: Self-pay

## 2011-05-13 ENCOUNTER — Encounter (HOSPITAL_COMMUNITY): Payer: Self-pay

## 2011-05-15 ENCOUNTER — Encounter (HOSPITAL_COMMUNITY): Payer: Self-pay

## 2011-05-17 ENCOUNTER — Encounter (HOSPITAL_COMMUNITY): Payer: Self-pay

## 2011-05-20 ENCOUNTER — Encounter (HOSPITAL_COMMUNITY): Payer: Self-pay

## 2011-05-22 ENCOUNTER — Encounter (HOSPITAL_COMMUNITY): Payer: Self-pay

## 2011-05-22 ENCOUNTER — Other Ambulatory Visit: Payer: Self-pay | Admitting: *Deleted

## 2011-05-24 ENCOUNTER — Encounter (HOSPITAL_COMMUNITY): Payer: Self-pay

## 2011-05-27 ENCOUNTER — Encounter (HOSPITAL_COMMUNITY): Payer: Self-pay

## 2011-05-28 ENCOUNTER — Other Ambulatory Visit: Payer: Self-pay | Admitting: *Deleted

## 2011-05-29 ENCOUNTER — Encounter (HOSPITAL_COMMUNITY): Payer: Self-pay

## 2011-05-31 ENCOUNTER — Encounter (HOSPITAL_COMMUNITY): Payer: Self-pay

## 2011-06-03 ENCOUNTER — Encounter (HOSPITAL_COMMUNITY): Payer: Self-pay

## 2011-06-05 ENCOUNTER — Encounter (HOSPITAL_COMMUNITY): Payer: Self-pay

## 2011-06-06 ENCOUNTER — Other Ambulatory Visit: Payer: Self-pay | Admitting: *Deleted

## 2011-06-07 ENCOUNTER — Encounter (HOSPITAL_COMMUNITY): Payer: Self-pay

## 2011-06-10 ENCOUNTER — Encounter (HOSPITAL_COMMUNITY): Payer: Self-pay

## 2011-06-12 ENCOUNTER — Encounter (HOSPITAL_COMMUNITY): Payer: Self-pay

## 2011-06-13 ENCOUNTER — Ambulatory Visit: Payer: Self-pay | Admitting: Cardiology

## 2011-06-14 ENCOUNTER — Encounter (HOSPITAL_COMMUNITY): Payer: Self-pay

## 2011-06-17 ENCOUNTER — Encounter (HOSPITAL_COMMUNITY): Payer: Self-pay

## 2011-06-19 ENCOUNTER — Encounter (HOSPITAL_COMMUNITY): Payer: Self-pay

## 2011-06-21 ENCOUNTER — Encounter (HOSPITAL_COMMUNITY): Payer: Self-pay

## 2011-06-24 ENCOUNTER — Encounter (HOSPITAL_COMMUNITY): Payer: Self-pay

## 2011-06-26 ENCOUNTER — Encounter (HOSPITAL_COMMUNITY): Payer: Self-pay

## 2011-06-28 ENCOUNTER — Encounter (HOSPITAL_COMMUNITY): Payer: Self-pay

## 2011-07-01 ENCOUNTER — Encounter (HOSPITAL_COMMUNITY): Payer: Self-pay

## 2011-07-03 ENCOUNTER — Encounter (HOSPITAL_COMMUNITY): Payer: Self-pay

## 2011-07-05 ENCOUNTER — Encounter (HOSPITAL_COMMUNITY): Payer: Self-pay

## 2011-07-08 ENCOUNTER — Encounter (HOSPITAL_COMMUNITY): Payer: Self-pay

## 2011-07-10 ENCOUNTER — Encounter (HOSPITAL_COMMUNITY): Payer: Self-pay

## 2011-07-12 ENCOUNTER — Encounter (HOSPITAL_COMMUNITY): Payer: Self-pay

## 2011-07-15 ENCOUNTER — Encounter (HOSPITAL_COMMUNITY): Payer: Self-pay

## 2011-07-17 ENCOUNTER — Encounter (HOSPITAL_COMMUNITY): Payer: Self-pay

## 2011-07-19 ENCOUNTER — Encounter (HOSPITAL_COMMUNITY): Payer: Self-pay

## 2011-07-22 ENCOUNTER — Encounter (HOSPITAL_COMMUNITY): Payer: Self-pay

## 2011-07-24 ENCOUNTER — Encounter (HOSPITAL_COMMUNITY): Payer: Self-pay

## 2011-07-26 ENCOUNTER — Encounter (HOSPITAL_COMMUNITY): Payer: Self-pay

## 2011-07-29 ENCOUNTER — Encounter (HOSPITAL_COMMUNITY): Payer: Self-pay

## 2011-07-31 ENCOUNTER — Encounter (HOSPITAL_COMMUNITY): Payer: Self-pay

## 2011-08-02 ENCOUNTER — Encounter (HOSPITAL_COMMUNITY): Payer: Self-pay

## 2011-08-05 ENCOUNTER — Encounter (HOSPITAL_COMMUNITY): Payer: Self-pay

## 2011-08-07 ENCOUNTER — Encounter (HOSPITAL_COMMUNITY): Payer: Self-pay

## 2011-08-09 ENCOUNTER — Encounter (HOSPITAL_COMMUNITY): Payer: Self-pay

## 2011-08-12 ENCOUNTER — Encounter (HOSPITAL_COMMUNITY): Payer: Self-pay

## 2011-08-14 ENCOUNTER — Encounter (HOSPITAL_COMMUNITY): Payer: Self-pay

## 2011-08-16 ENCOUNTER — Encounter (HOSPITAL_COMMUNITY): Payer: Self-pay

## 2011-09-17 ENCOUNTER — Other Ambulatory Visit: Payer: Self-pay | Admitting: Internal Medicine

## 2011-09-17 MED ORDER — LISINOPRIL 5 MG PO TABS: 5.0000 mg | ORAL_TABLET | Freq: Every day | ORAL | Status: DC

## 2011-09-17 MED ORDER — ATORVASTATIN CALCIUM 40 MG PO TABS: 40.0000 mg | ORAL_TABLET | Freq: Every day | ORAL | Status: DC

## 2011-10-19 ENCOUNTER — Other Ambulatory Visit: Payer: Self-pay | Admitting: *Deleted

## 2011-10-19 MED ORDER — LISINOPRIL 5 MG PO TABS: 5.0000 mg | ORAL_TABLET | Freq: Every day | ORAL | Status: DC

## 2011-10-19 NOTE — Telephone Encounter (Signed)
Fax Received. Refill Completed. [name] (R.M.A)   

## 2017-02-26 DIAGNOSIS — H2513 Age-related nuclear cataract, bilateral: Secondary | ICD-10-CM | POA: Diagnosis not present

## 2017-02-26 DIAGNOSIS — H40033 Anatomical narrow angle, bilateral: Secondary | ICD-10-CM | POA: Diagnosis not present

## 2017-03-03 DIAGNOSIS — D492 Neoplasm of unspecified behavior of bone, soft tissue, and skin: Secondary | ICD-10-CM | POA: Diagnosis not present

## 2017-05-05 ENCOUNTER — Other Ambulatory Visit: Payer: Self-pay

## 2017-05-05 DIAGNOSIS — R300 Dysuria: Secondary | ICD-10-CM | POA: Diagnosis not present

## 2017-05-05 DIAGNOSIS — Z113 Encounter for screening for infections with a predominantly sexual mode of transmission: Secondary | ICD-10-CM | POA: Diagnosis not present

## 2017-05-05 NOTE — Patient Outreach (Signed)
[facility]) Care Management  05/05/2017  Dennis Harmon 1950/09/09 [id number]   Telephone call to patient for follow up from Nurse Practitioner visit.  Patient states this is not a good time to talk.  Advised patient that I would call another time.  Plan: RN CM will contact patient again within 3 business days.    Jone Baseman, RN, MSN [facility] Care Management Care Management Coordinator Direct Line [phone] Toll Free: [phone]  Fax: [phone]

## 2017-05-06 ENCOUNTER — Other Ambulatory Visit: Payer: Self-pay

## 2017-05-06 NOTE — Patient Outreach (Signed)
[facility]) Care Management  05/06/2017  Dennis Harmon 1950-07-13 [id number]   Telephone call to patient for follow up with nurse line call.  Patient able to verify HIPAA.  Patient states he is not sure what happened when the nurse practitioner came but when she started talking about taking blood he states that he just got nervous. Patient verified he does have an appointment with her this week but is thinking he will see the doctor all his family sees in [place].  Discussed with patient the importance of having a primary care physician.  He verbalized understanding.  Discussed with patient [place] and how we could help him. He declined as he states he has no needs right now.  Patient is agreeable to receive letter and brochure.  Plan: RN CM will send letter and close case. RN CM will notify care management assistant of case status.    Jone Baseman, RN, MSN [facility] Care Management Care Management Coordinator Direct Line [phone] Toll Free: [phone]  Fax: [phone]

## 2017-05-08 ENCOUNTER — Ambulatory Visit: Payer: Self-pay | Admitting: Nurse Practitioner

## 2018-04-20 ENCOUNTER — Inpatient Hospital Stay (HOSPITAL_COMMUNITY)
Admission: EM | Admit: 2018-04-20 | Discharge: 2018-04-22 | DRG: 065 | Disposition: A | Discharge status: Home Health | Payer: PPO | Attending: Internal Medicine | Admitting: Internal Medicine

## 2018-04-20 ENCOUNTER — Other Ambulatory Visit: Payer: Self-pay

## 2018-04-20 ENCOUNTER — Inpatient Hospital Stay (HOSPITAL_COMMUNITY): Payer: PPO

## 2018-04-20 ENCOUNTER — Emergency Department (HOSPITAL_COMMUNITY): Payer: PPO

## 2018-04-20 ENCOUNTER — Inpatient Hospital Stay (HOSPITAL_COMMUNITY): Admit: 2018-04-20 | Payer: PPO

## 2018-04-20 ENCOUNTER — Inpatient Hospital Stay (HOSPITAL_COMMUNITY)
Admission: EM | Admit: 2018-04-20 | Discharge: 2018-04-20 | Disposition: A | Discharge status: Home / Self Care | Payer: PPO | Source: Home / Self Care | Attending: Internal Medicine | Admitting: Internal Medicine

## 2018-04-20 ENCOUNTER — Encounter (HOSPITAL_COMMUNITY): Payer: Self-pay | Admitting: Emergency Medicine

## 2018-04-20 DIAGNOSIS — F17210 Nicotine dependence, cigarettes, uncomplicated: Secondary | ICD-10-CM | POA: Diagnosis present

## 2018-04-20 DIAGNOSIS — I6302 Cerebral infarction due to thrombosis of basilar artery: Secondary | ICD-10-CM | POA: Diagnosis not present

## 2018-04-20 DIAGNOSIS — R4701 Aphasia: Secondary | ICD-10-CM | POA: Diagnosis present

## 2018-04-20 DIAGNOSIS — R4781 Slurred speech: Secondary | ICD-10-CM | POA: Diagnosis not present

## 2018-04-20 DIAGNOSIS — G8194 Hemiplegia, unspecified affecting left nondominant side: Secondary | ICD-10-CM | POA: Diagnosis not present

## 2018-04-20 DIAGNOSIS — R278 Other lack of coordination: Secondary | ICD-10-CM | POA: Diagnosis not present

## 2018-04-20 DIAGNOSIS — Z720 Tobacco use: Secondary | ICD-10-CM | POA: Diagnosis present

## 2018-04-20 DIAGNOSIS — R29810 Facial weakness: Secondary | ICD-10-CM | POA: Diagnosis not present

## 2018-04-20 DIAGNOSIS — Z9119 Patient's noncompliance with other medical treatment and regimen: Secondary | ICD-10-CM | POA: Diagnosis not present

## 2018-04-20 DIAGNOSIS — Z7902 Long term (current) use of antithrombotics/antiplatelets: Secondary | ICD-10-CM | POA: Diagnosis not present

## 2018-04-20 DIAGNOSIS — I2510 Atherosclerotic heart disease of native coronary artery without angina pectoris: Secondary | ICD-10-CM | POA: Diagnosis not present

## 2018-04-20 DIAGNOSIS — I10 Essential (primary) hypertension: Secondary | ICD-10-CM | POA: Diagnosis present

## 2018-04-20 DIAGNOSIS — R29702 NIHSS score 2: Secondary | ICD-10-CM | POA: Diagnosis not present

## 2018-04-20 DIAGNOSIS — Z791 Long term (current) use of non-steroidal anti-inflammatories (NSAID): Secondary | ICD-10-CM

## 2018-04-20 DIAGNOSIS — I252 Old myocardial infarction: Secondary | ICD-10-CM | POA: Diagnosis not present

## 2018-04-20 DIAGNOSIS — Z8673 Personal history of transient ischemic attack (TIA), and cerebral infarction without residual deficits: Secondary | ICD-10-CM

## 2018-04-20 DIAGNOSIS — H0520 Unspecified exophthalmos: Secondary | ICD-10-CM | POA: Diagnosis not present

## 2018-04-20 DIAGNOSIS — I639 Cerebral infarction, unspecified: Secondary | ICD-10-CM | POA: Diagnosis present

## 2018-04-20 DIAGNOSIS — Z91199 Patient's noncompliance with other medical treatment and regimen due to unspecified reason: Secondary | ICD-10-CM

## 2018-04-20 DIAGNOSIS — Z7982 Long term (current) use of aspirin: Secondary | ICD-10-CM | POA: Diagnosis not present

## 2018-04-20 DIAGNOSIS — I6381 Other cerebral infarction due to occlusion or stenosis of small artery: Secondary | ICD-10-CM | POA: Diagnosis not present

## 2018-04-20 DIAGNOSIS — I6789 Other cerebrovascular disease: Secondary | ICD-10-CM | POA: Diagnosis not present

## 2018-04-20 DIAGNOSIS — E785 Hyperlipidemia, unspecified: Secondary | ICD-10-CM | POA: Diagnosis present

## 2018-04-20 DIAGNOSIS — Z79899 Other long term (current) drug therapy: Secondary | ICD-10-CM | POA: Diagnosis not present

## 2018-04-20 LAB — DIFFERENTIAL
Abs Immature Granulocytes: 0.04 10*3/uL (ref 0.00–0.07)
Basophils Absolute: 0 10*3/uL (ref 0.0–0.1)
Basophils Relative: 0 %
Eosinophils Absolute: 0.1 10*3/uL (ref 0.0–0.5)
Eosinophils Relative: 2 %
Immature Granulocytes: 1 %
Lymphocytes Relative: 44 %
Lymphs Abs: 3.2 10*3/uL (ref 0.7–4.0)
Monocytes Absolute: 0.6 10*3/uL (ref 0.1–1.0)
Monocytes Relative: 8 %
Neutro Abs: 3.3 10*3/uL (ref 1.7–7.7)
Neutrophils Relative %: 45 %

## 2018-04-20 LAB — CBC
HCT: 41.6 % (ref 39.0–52.0)
HCT: 40.9 % (ref 39.0–52.0)
Hemoglobin: 13.4 g/dL (ref 13.0–17.0)
Hemoglobin: 13.4 g/dL (ref 13.0–17.0)
MCH: 31.5 pg (ref 26.0–34.0)
MCH: 32.2 pg (ref 26.0–34.0)
MCHC: 32.2 g/dL (ref 30.0–36.0)
MCHC: 32.8 g/dL (ref 30.0–36.0)
MCV: 97.9 fL (ref 80.0–100.0)
MCV: 98.3 fL (ref 80.0–100.0)
Platelets: 340 10*3/uL (ref 150–400)
Platelets: 349 10*3/uL (ref 150–400)
RBC: 4.25 MIL/uL (ref 4.22–5.81)
RBC: 4.16 MIL/uL — ABNORMAL LOW (ref 4.22–5.81)
RDW: 14.8 % (ref 11.5–15.5)
RDW: 14.8 % (ref 11.5–15.5)
WBC: 7.4 10*3/uL (ref 4.0–10.5)
WBC: 7.2 10*3/uL (ref 4.0–10.5)
nRBC: 0 % (ref 0.0–0.2)
nRBC: 0 % (ref 0.0–0.2)

## 2018-04-20 LAB — COMPREHENSIVE METABOLIC PANEL
ALT: 22 U/L (ref 0–44)
AST: 27 U/L (ref 15–41)
Albumin: 4.8 g/dL (ref 3.5–5.0)
Alkaline Phosphatase: 82 U/L (ref 38–126)
Anion gap: 9 (ref 5–15)
BUN: 15 mg/dL (ref 8–23)
CO2: 24 mmol/L (ref 22–32)
Calcium: 9.6 mg/dL (ref 8.9–10.3)
Chloride: 106 mmol/L (ref 98–111)
Creatinine, Ser: 0.87 mg/dL (ref 0.61–1.24)
GFR calc Af Amer: >60 mL/min (ref >60)
GFR calc non Af Amer: >60 mL/min (ref >60)
Glucose, Bld: 91 mg/dL (ref 70–99)
Potassium: 4.1 mmol/L (ref 3.5–5.1)
Sodium: 139 mmol/L (ref 135–145)
Total Bilirubin: 1.1 mg/dL (ref 0.3–1.2)
Total Protein: 8.9 g/dL — ABNORMAL HIGH (ref 6.5–8.1)

## 2018-04-20 LAB — I-STAT CHEM 8, ED
BUN: 14 mg/dL (ref 8–23)
Calcium, Ion: 1.21 mmol/L (ref 1.15–1.40)
Chloride: 106 mmol/L (ref 98–111)
Creatinine, Ser: 0.8 mg/dL (ref 0.61–1.24)
Glucose, Bld: 87 mg/dL (ref 70–99)
HCT: 44 % (ref 39.0–52.0)
Hemoglobin: 15 g/dL (ref 13.0–17.0)
Potassium: 4.1 mmol/L (ref 3.5–5.1)
Sodium: 139 mmol/L (ref 135–145)
TCO2: 26 mmol/L (ref 22–32)

## 2018-04-20 LAB — PROTIME-INR
INR: 0.97
Prothrombin Time: 12.8 seconds (ref 11.4–15.2)

## 2018-04-20 LAB — I-STAT TROPONIN, ED: Troponin i, poc: 0 ng/mL (ref 0.00–0.08)

## 2018-04-20 LAB — APTT: aPTT: 29 seconds (ref 24–36)

## 2018-04-20 LAB — CREATININE, SERUM
Creatinine, Ser: 0.92 mg/dL (ref 0.61–1.24)
GFR calc Af Amer: >60 mL/min (ref >60)
GFR calc non Af Amer: >60 mL/min (ref >60)

## 2018-04-20 LAB — CBG MONITORING, ED: Glucose-Capillary: 78 mg/dL (ref 70–99)

## 2018-04-20 MED ORDER — STROKE: EARLY STAGES OF RECOVERY BOOK
Freq: Once | Status: DC
  Filled 2018-04-20 (×2): qty 1

## 2018-04-20 MED ORDER — ACETAMINOPHEN 650 MG RE SUPP: 650.0000 mg | RECTAL | Status: DC | PRN

## 2018-04-20 MED ORDER — ATORVASTATIN CALCIUM 80 MG PO TABS
80.0000 mg | ORAL_TABLET | Freq: Every day | ORAL | Status: DC
  Filled 2018-04-20 (×2): qty 1

## 2018-04-20 MED ORDER — ASPIRIN 81 MG PO CHEW
324.0000 mg | CHEWABLE_TABLET | Freq: Once | ORAL | Status: AC
  Administered 2018-04-20: 324 mg via ORAL
  Filled 2018-04-20: qty 4

## 2018-04-20 MED ORDER — SENNOSIDES-DOCUSATE SODIUM 8.6-50 MG PO TABS: 1.0000 | ORAL_TABLET | Freq: Every evening | ORAL | Status: DC | PRN

## 2018-04-20 MED ORDER — ACETAMINOPHEN 160 MG/5ML PO SOLN: 650.0000 mg | ORAL | Status: DC | PRN

## 2018-04-20 MED ORDER — NICOTINE 21 MG/24HR TD PT24
21.0000 mg | MEDICATED_PATCH | Freq: Every day | TRANSDERMAL | Status: DC
  Administered 2018-04-21: 21 mg via TRANSDERMAL
  Filled 2018-04-20 (×3): qty 1

## 2018-04-20 MED ORDER — ENOXAPARIN SODIUM 40 MG/0.4ML ~~LOC~~ SOLN
40.0000 mg | SUBCUTANEOUS | Status: DC
  Administered 2018-04-20 – 2018-04-21 (×2): 40 mg via SUBCUTANEOUS
  Filled 2018-04-20 (×2): qty 0.4

## 2018-04-20 MED ORDER — ACETAMINOPHEN 325 MG PO TABS: 650.0000 mg | ORAL_TABLET | ORAL | Status: DC | PRN

## 2018-04-20 NOTE — ED Triage Notes (Signed)
Pt verbalizes went to sleep around 2100 and awoke around 0600 this morning with unsteady gait, bilateral leg weakness, and slurred speech. Denies numbness, left facial drop noted with smile and left hand grip less than right hand grip.

## 2018-04-20 NOTE — Progress Notes (Signed)
1500 The patient is being transported to MRI. Per MRI tech, the testing will take at least a half hour. Will try again later. GC

## 2018-04-20 NOTE — ED Notes (Signed)
Provider aware of pt status and complaint.

## 2018-04-20 NOTE — ED Provider Notes (Signed)
[facility] DEPT Provider Note   CSN: [id number] Arrival date & time: 04/20/18  1227   History   Chief Complaint Chief Complaint  Patient presents with  . Gait Problem  . Aphasia    HPI Dennis Harmon is a 67 y.o. male.  HPI   67 year old male with complaint of slurred speech and unsteady gait.  Noticed when he woke up this morning.  He went to sleep between 9-9:30 PM yesterday in his usual state of health.  He notes that he was having a hard time walking.  He felt like it is could not get his legs to work the way they needed to.  He felt like his speech is slurred but this has since improved.  Denies any acute pain.  No visual complaints.  Past Medical History:  Diagnosis Date  . Coronary artery disease    NSTEMI 9/12:  cath 01/31/11: EF 60%, Inf HK, mRCA 30-40%, LAD ok, mCFX 50%, OM2 occluded (likely > 24hr with Timi 1 flow).  Medical Tx was recommended and PCI would be deferred for recurrent ischemia.  Echo 01/31/11: mild LVH, EF 55%, AL HK, grade 1 diast dysfxn, PASP 27-31  . HTN (hypertension)   . Hyperlipidemia   . Tobacco abuse     Patient Active Problem List   Diagnosis Date Noted  . Angina of effort ([place]) 02/18/2011  . CAD (coronary artery disease) 02/18/2011  . Hypertension 02/18/2011  . Hyperlipidemia 02/18/2011  . Tobacco abuse 02/18/2011    Past Surgical History:  Procedure Laterality Date  . CARDIAC CATHETERIZATION  01/31/11  . THUMB SURGERY     RIGHT THUMB LIGAMENT SURGERY        Home Medications    Prior to Admission medications   Medication Sig Start Date End Date Taking? Authorizing Provider  aspirin 81 MG tablet Take 81 mg by mouth daily.      [provider]  atorvastatin (LIPITOR) 40 MG tablet Take 1 tablet (40 mg total) by mouth daily. At bedtime 09/17/11   Bensimhon, Shaune Pascal, MD  clopidogrel (PLAVIX) 75 MG tablet Take 75 mg by mouth daily.      [provider]  isosorbide mononitrate (IMDUR) 30 MG  24 hr tablet Take 1 tablet (30 mg total) by mouth daily. 02/21/11 02/21/12  Larey Dresser, MD  lisinopril (PRINIVIL,ZESTRIL) 5 MG tablet Take 1 tablet (5 mg total) by mouth daily. 10/19/11   Bensimhon, Shaune Pascal, MD  metoprolol tartrate (LOPRESSOR) 25 MG tablet Take 2 tablets (50 mg total) by mouth 2 (two) times daily. 02/18/11   Richardson Dopp T, PA-C  nitroGLYCERIN (NITROSTAT) 0.4 MG SL tablet Place 0.4 mg under the tongue every 5 (five) minutes as needed.      [provider]    Family History Family History  Problem Relation Age of Onset  . Stroke Neg Hx        UNCLE HAD CAD AND HAD A CVA    Social History Social History   Tobacco Use  . Smoking status: Current Every Day Smoker    Packs/day: 1.50    Years: 30.00    Pack years: 45.00    Types: Cigarettes  Substance Use Topics  . Alcohol use: Yes    Comment: 2 BEERS DAILY  . Drug use: No     Allergies   Patient has no known allergies.   Review of Systems Review of Systems  All systems reviewed and negative, other than as noted in  HPI.  Physical Exam Updated Vital Signs BP (!) 107/96 (BP Location: Left Arm)   Pulse 79   Temp 98.4 F (36.9 C) (Oral)   Resp 16   Ht 5\' 6"  (1.676 m)   Wt 86.2 kg   SpO2 100%   BMI 30.67 kg/m   Physical Exam  Constitutional: He appears well-developed and well-nourished. No distress.  HENT:  Head: Normocephalic and atraumatic.  Eyes: Pupils are equal, round, and reactive to light. Conjunctivae and EOM are normal. Right eye exhibits no discharge. Left eye exhibits no discharge.  Neck: Neck supple.  Cardiovascular: Normal rate, regular rhythm and normal heart sounds. Exam reveals no gallop and no friction rub.  No murmur heard. Pulmonary/Chest: Effort normal and breath sounds normal. No respiratory distress.  Abdominal: Soft. He exhibits no distension. There is no tenderness.  Musculoskeletal: He exhibits no edema or tenderness.  Neurological: He is alert.  Alert.  Oriented  x3.  Speech is clear.  Content appropriate.  Following commands.  Left-sided facial droop.  Strength is 4 out of 5 left upper left lower extremity.  5 out of 5 on the right.  Sensation is intact light touch.  Skin: Skin is warm and dry.  Psychiatric: He has a normal mood and affect. His behavior is normal. Thought content normal.  Nursing note and vitals reviewed.    ED Treatments / Results  Labs (all labs ordered are listed, but only abnormal results are displayed) Labs Reviewed  COMPREHENSIVE METABOLIC PANEL - Abnormal; Notable for the following components:      Result Value   Total Protein 8.9 (*)    All other components within normal limits  CBC - Abnormal; Notable for the following components:   RBC 4.16 (*)    All other components within normal limits  PROTIME-INR  APTT  CBC  DIFFERENTIAL  CREATININE, SERUM  HIV ANTIBODY (ROUTINE TESTING W REFLEX)  I-STAT TROPONIN, ED  CBG MONITORING, ED  I-STAT CHEM 8, ED    EKG None  Radiology Ct Head Wo Contrast  Result Date: 04/20/2018 CLINICAL DATA:  67 year old male awoke this morning with unsteady gait and bilateral leg weakness and slurred speech. Left facial droop. Decrease left hand grip. 51 Initial encounter. EXAM: CT HEAD WITHOUT CONTRAST TECHNIQUE: Contiguous axial images were obtained from the base of the skull through the vertex without intravenous contrast. COMPARISON:  None. FINDINGS: Brain: No intracranial hemorrhage or CT evidence of large acute infarct. MR can be obtained if infarct remains of high clinical concern. No intracranial mass lesion noted on this unenhanced exam. Vascular: Calcified plaque cavernous segment internal carotid artery bilaterally. No acute hyperdense vessel. Skull: No acute abnormality. Sinuses/Orbits: Exophthalmos. Lateral to the inferior aspect of the left orbit is a 1.2 cm rounded low-density structure of indeterminate etiology. Clinical correlation recommended. Visualized paranasal sinuses are  clear. Other: Mastoid air cells and middle ear cavities are clear. IMPRESSION: 1. No intracranial hemorrhage or CT evidence of large acute infarct. 2. Calcified plaque cavernous segment internal carotid artery bilaterally. 3. Exophthalmos. Lateral to the inferior aspect of the left orbit is a 1.2 cm rounded low-density structure of indeterminate etiology. Clinical correlation recommended. Electronically Signed   By: Genia Del M.D.   On: 04/20/2018 13:54    Procedures Procedures (including critical care time)  Medications Ordered in ED Medications - No data to display   Initial Impression / Assessment and Plan / ED Course  I have reviewed the triage vital signs and the nursing notes.  Pertinent labs & imaging results that were available during my care of the patient were reviewed by me and considered in my medical decision making (see chart for details).  67 year old male with symptoms consistent with CVA.  Last known normal was approximately 9 PM yesterday.  He does not have symptoms to suggest large vessel occlusion.  He is outside the window for TPA.  Will obtain non-emergent neurology consultation.  We will initiate the work-up.  Admission.  Will discuss with neurology at [facility]. Possible transfer.  Final Clinical Impressions(s) / ED Diagnoses   Final diagnoses:  Cerebrovascular accident (CVA), unspecified mechanism [facility])    ED Discharge Orders    None       Virgel Manifold, MD 04/20/18 [phone]

## 2018-04-20 NOTE — ED Notes (Signed)
Patient transported to CT 

## 2018-04-20 NOTE — ED Notes (Signed)
ED TO INPATIENT HANDOFF REPORT  Name/Age/Gender Dennis Harmon 67 y.o. male  Code Status    Code Status Orders  (From admission, onward)         Start     Ordered   04/20/18 1431  Full code  Continuous     04/20/18 1432        Code Status History    This patient has a current code status but no historical code status.      Home/SNF/Other Home  Chief Complaint slurred speech;leg numbness  Level of Care/Admitting Diagnosis ED Disposition    ED Disposition Condition Comment   Admit  Hospital Area: [facility] [[id number]]  Level of Care: Telemetry [5]  Diagnosis: CVA (cerebral vascular accident) [facility]) [[id number]]  Admitting Physician: [place], [place]  Attending Physician: Debbe Odea [[id number]]  Estimated length of stay: past midnight tomorrow  Certification:: I certify this patient will need inpatient services for at least 2 midnights  PT Class (Do Not Modify): Inpatient [101]  PT Acc Code (Do Not Modify): Private [1]       Medical History Past Medical History:  Diagnosis Date  . Coronary artery disease    NSTEMI 9/12:  cath 01/31/11: EF 60%, Inf HK, mRCA 30-40%, LAD ok, mCFX 50%, OM2 occluded (likely > 24hr with Timi 1 flow).  Medical Tx was recommended and PCI would be deferred for recurrent ischemia.  Echo 01/31/11: mild LVH, EF 55%, AL HK, grade 1 diast dysfxn, PASP 27-31  . HTN (hypertension)   . Hyperlipidemia   . Tobacco abuse     Allergies No Known Allergies  IV Location/Drains/Wounds Patient Lines/Drains/Airways Status   Active Line/Drains/Airways    Name:   Placement date:   Placement time:   Site:   Days:   Peripheral IV 04/20/18 Right;Upper Forearm   04/20/18    1414    Forearm   less than 1          Labs/Imaging Results for orders placed or performed during the hospital encounter of 04/20/18 (from the past 48 hour(s))  Protime-INR     Status: None   Collection Time: 04/20/18  1:54 PM  Result Value Ref Range   Prothrombin  Time 12.8 11.4 - 15.2 seconds   INR 0.97     Comment: Performed at [facility], [place] [address], [place], [place] [address]  APTT     Status: None   Collection Time: 04/20/18  1:54 PM  Result Value Ref Range   aPTT 29 24 - 36 seconds    Comment: Performed at [facility], [place] [address] [place], [place] [address]  CBC     Status: None   Collection Time: 04/20/18  1:54 PM  Result Value Ref Range   WBC 7.4 4.0 - 10.5 K/uL   RBC 4.25 4.22 - 5.81 MIL/uL   Hemoglobin 13.4 13.0 - 17.0 g/dL   HCT 41.6 39.0 - 52.0 %   MCV 97.9 80.0 - 100.0 fL   MCH 31.5 26.0 - 34.0 pg   MCHC 32.2 30.0 - 36.0 g/dL   RDW 14.8 11.5 - 15.5 %   Platelets 340 150 - 400 K/uL   nRBC 0.0 0.0 - 0.2 %    Comment: Performed at [facility], [place] [address] [place],  [address]  Differential     Status: None   Collection Time: 04/20/18  1:54 PM  Result Value Ref Range   Neutrophils Relative % 45 %  Neutro Abs 3.3 1.7 - 7.7 K/uL   Lymphocytes Relative 44 %   Lymphs Abs 3.2 0.7 - 4.0 K/uL   Monocytes Relative 8 %   Monocytes Absolute 0.6 0.1 - 1.0 K/uL   Eosinophils Relative 2 %   Eosinophils Absolute 0.1 0.0 - 0.5 K/uL   Basophils Relative 0 %   Basophils Absolute 0.0 0.0 - 0.1 K/uL   Immature Granulocytes 1 %   Abs Immature Granulocytes 0.04 0.00 - 0.07 K/uL    Comment: Performed at [facility], [place] [address] [place], [place] [address]  Comprehensive metabolic panel     Status: Abnormal   Collection Time: 04/20/18  1:54 PM  Result Value Ref Range   Sodium 139 135 - 145 mmol/L   Potassium 4.1 3.5 - 5.1 mmol/L   Chloride 106 98 - 111 mmol/L   CO2 24 22 - 32 mmol/L   Glucose, Bld 91 70 - 99 mg/dL   BUN 15 8 - 23 mg/dL   Creatinine, Ser 0.87 0.61 - 1.24 mg/dL   Calcium 9.6 8.9 - 10.3 mg/dL   Total Protein 8.9 (H) 6.5 - 8.1 g/dL   Albumin 4.8 3.5 - 5.0 g/dL   AST 27 15 - 41 U/L   ALT 22 0 - 44 U/L   Alkaline Phosphatase  82 38 - 126 U/L   Total Bilirubin 1.1 0.3 - 1.2 mg/dL   GFR calc non Af Amer >60 >60 mL/min   GFR calc Af Amer >60 >60 mL/min   Anion gap 9 5 - 15    Comment: Performed at [facility], [place] [address], [place], [place] [address]  CBC     Status: Abnormal   Collection Time: 04/20/18  1:54 PM  Result Value Ref Range   WBC 7.2 4.0 - 10.5 K/uL   RBC 4.16 (L) 4.22 - 5.81 MIL/uL   Hemoglobin 13.4 13.0 - 17.0 g/dL   HCT 40.9 39.0 - 52.0 %   MCV 98.3 80.0 - 100.0 fL   MCH 32.2 26.0 - 34.0 pg   MCHC 32.8 30.0 - 36.0 g/dL   RDW 14.8 11.5 - 15.5 %   Platelets 349 150 - 400 K/uL   nRBC 0.0 0.0 - 0.2 %    Comment: Performed at [facility], [place] [address] [place], [place] [address]  Creatinine, serum     Status: None   Collection Time: 04/20/18  1:54 PM  Result Value Ref Range   Creatinine, Ser 0.92 0.61 - 1.24 mg/dL   GFR calc non Af Amer >60 >60 mL/min   GFR calc Af Amer >60 >60 mL/min    Comment: Performed at [facility], [place] [address] [place], [place] [address]  I-Stat Chem 8, ED     Status: None   Collection Time: 04/20/18  2:00 PM  Result Value Ref Range   Sodium 139 135 - 145 mmol/L   Potassium 4.1 3.5 - 5.1 mmol/L   Chloride 106 98 - 111 mmol/L   BUN 14 8 - 23 mg/dL   Creatinine, Ser 0.80 0.61 - 1.24 mg/dL   Glucose, Bld 87 70 - 99 mg/dL   Calcium, Ion 1.21 1.15 - 1.40 mmol/L   TCO2 26 22 - 32 mmol/L   Hemoglobin 15.0 13.0 - 17.0 g/dL   HCT 44.0 39.0 - 52.0 %  I-stat troponin, ED     Status: None   Collection Time: 04/20/18  2:03 PM  Result Value  Ref Range   Troponin i, poc 0.00 0.00 - 0.08 ng/mL   Comment 3            Comment: Due to the release kinetics of cTnI, a negative result within the first hours of the onset of symptoms does not rule out myocardial infarction with certainty. If myocardial infarction is still suspected, repeat the test at appropriate intervals.   CBG monitoring, ED     Status: None    Collection Time: 04/20/18  2:26 PM  Result Value Ref Range   Glucose-Capillary 78 70 - 99 mg/dL   Ct Head Wo Contrast  Result Date: 04/20/2018 CLINICAL DATA:  67 year old male awoke this morning with unsteady gait and bilateral leg weakness and slurred speech. Left facial droop. Decrease left hand grip. 51 Initial encounter. EXAM: CT HEAD WITHOUT CONTRAST TECHNIQUE: Contiguous axial images were obtained from the base of the skull through the vertex without intravenous contrast. COMPARISON:  None. FINDINGS: Brain: No intracranial hemorrhage or CT evidence of large acute infarct. MR can be obtained if infarct remains of high clinical concern. No intracranial mass lesion noted on this unenhanced exam. Vascular: Calcified plaque cavernous segment internal carotid artery bilaterally. No acute hyperdense vessel. Skull: No acute abnormality. Sinuses/Orbits: Exophthalmos. Lateral to the inferior aspect of the left orbit is a 1.2 cm rounded low-density structure of indeterminate etiology. Clinical correlation recommended. Visualized paranasal sinuses are clear. Other: Mastoid air cells and middle ear cavities are clear. IMPRESSION: 1. No intracranial hemorrhage or CT evidence of large acute infarct. 2. Calcified plaque cavernous segment internal carotid artery bilaterally. 3. Exophthalmos. Lateral to the inferior aspect of the left orbit is a 1.2 cm rounded low-density structure of indeterminate etiology. Clinical correlation recommended. Electronically Signed   By: Genia Del M.D.   On: 04/20/2018 13:54   Mr Brain Wo Contrast  Result Date: 04/20/2018 CLINICAL DATA:  67 y/o M; woke with unsteady gait, bilateral leg weakness, and slurred speech. Left facial droop and left-sided weakness. EXAM: MRI HEAD WITHOUT CONTRAST MRA HEAD WITHOUT CONTRAST TECHNIQUE: Multiplanar, multiecho pulse sequences of the brain and surrounding structures were obtained without intravenous contrast. Angiographic images of the head were  obtained using MRA technique without contrast. COMPARISON:  04/20/2018 CT head. FINDINGS: MRI HEAD FINDINGS Brain: 13 x 8 mm focus of reduced diffusion (series 3, image 20) in the right paramedian hemi pons compatible with acute/early subacute infarction. No associated hemorrhage or mass effect. No extra-axial collection, hydrocephalus, mass effect, or herniation. Vascular: Normal flow voids. Skull and upper cervical spine: Normal marrow signal. Sinuses/Orbits: Small mucous retention cyst within the right maxillary sinus. No additional abnormal signal of the paranasal sinuses or mastoid air cells. Orbits are unremarkable. Other: Left inferolateral periorbital dermal cyst. MRA HEAD FINDINGS Internal carotid arteries: Patent. Mild non stenotic irregularity of the carotid siphons compatible with atherosclerotic disease. Anterior cerebral arteries:  Patent. Middle cerebral arteries: Patent. Anterior communicating artery: Patent. Posterior communicating arteries: Not identified, likely hypoplastic or absent. Posterior cerebral arteries:  Patent. Basilar artery:  Patent. Vertebral arteries:  Patent. No evidence of high-grade stenosis, large vessel occlusion, or aneurysm. IMPRESSION: 1. 13 mm acute/early subacute infarction in the right paramedian pons. No associated hemorrhage or mass effect. 2. Patent anterior and posterior intracranial circulation. No large vessel occlusion, aneurysm, or significant stenosis is identified. These results will be called to the ordering clinician or representative by the Radiologist Assistant, and communication documented in the PACS or zVision Dashboard. Electronically Signed   By: Mia [place]  [place] M.D.   On: 04/20/2018 16:20   Mr Jodene Nam Head Wo Contrast  Result Date: 04/20/2018 CLINICAL DATA:  67 y/o M; woke with unsteady gait, bilateral leg weakness, and slurred speech. Left facial droop and left-sided weakness. EXAM: MRI HEAD WITHOUT CONTRAST MRA HEAD WITHOUT CONTRAST  TECHNIQUE: Multiplanar, multiecho pulse sequences of the brain and surrounding structures were obtained without intravenous contrast. Angiographic images of the head were obtained using MRA technique without contrast. COMPARISON:  04/20/2018 CT head. FINDINGS: MRI HEAD FINDINGS Brain: 13 x 8 mm focus of reduced diffusion (series 3, image 20) in the right paramedian hemi pons compatible with acute/early subacute infarction. No associated hemorrhage or mass effect. No extra-axial collection, hydrocephalus, mass effect, or herniation. Vascular: Normal flow voids. Skull and upper cervical spine: Normal marrow signal. Sinuses/Orbits: Small mucous retention cyst within the right maxillary sinus. No additional abnormal signal of the paranasal sinuses or mastoid air cells. Orbits are unremarkable. Other: Left inferolateral periorbital dermal cyst. MRA HEAD FINDINGS Internal carotid arteries: Patent. Mild non stenotic irregularity of the carotid siphons compatible with atherosclerotic disease. Anterior cerebral arteries:  Patent. Middle cerebral arteries: Patent. Anterior communicating artery: Patent. Posterior communicating arteries: Not identified, likely hypoplastic or absent. Posterior cerebral arteries:  Patent. Basilar artery:  Patent. Vertebral arteries:  Patent. No evidence of high-grade stenosis, large vessel occlusion, or aneurysm. IMPRESSION: 1. 13 mm acute/early subacute infarction in the right paramedian pons. No associated hemorrhage or mass effect. 2. Patent anterior and posterior intracranial circulation. No large vessel occlusion, aneurysm, or significant stenosis is identified. These results will be called to the ordering clinician or representative by the Radiologist Assistant, and communication documented in the PACS or zVision Dashboard. Electronically Signed   By: Kristine Garbe M.D.   On: 04/20/2018 16:20   EKG Interpretation  Date/Time:  Monday April 20 2018 13:37:54 EST Ventricular  Rate:  77 PR Interval:    QRS Duration: 91 QT Interval:  385 QTC Calculation: 436 R Axis:   -53 Text Interpretation:  Sinus rhythm Prolonged PR interval Probable left atrial enlargement Left anterior fascicular block Abnormal R-wave progression, late transition Confirmed by Virgel Manifold [phone]) on 04/20/2018 4:16:42 PM   Pending Labs Unresulted Labs (From admission, onward)    Start     Ordered   04/27/18 0500  Creatinine, serum  (enoxaparin (LOVENOX)    CrCl >/= 30 ml/min)  Weekly,   R    Comments:  while on enoxaparin therapy    04/20/18 1432   04/21/18 0500  Hemoglobin A1c  Tomorrow morning,   R     04/20/18 1432   04/21/18 0500  Lipid panel  Tomorrow morning,   R    Comments:  Fasting    04/20/18 1432   04/20/18 1429  HIV antibody (Routine Testing)  Once,   R     04/20/18 1432          Vitals/Pain Today's Vitals   04/20/18 2030 04/20/18 2100 04/20/18 2130 04/20/18 2200  BP: (!) 147/83 137/70 (!) 150/91 (!) 150/65  Pulse: 77 77 80 67  Resp: 17 19 19 20   Temp:      TempSrc:      SpO2: 97% 97% 93% 96%  Weight:      Height:      PainSc:        Isolation Precautions No active isolations  Medications Medications   stroke: mapping our early stages of recovery book (has no administration in time range)  acetaminophen (TYLENOL) tablet  650 mg (has no administration in time range)    Or  acetaminophen (TYLENOL) solution 650 mg (has no administration in time range)    Or  acetaminophen (TYLENOL) suppository 650 mg (has no administration in time range)  senna-docusate (Senokot-S) tablet 1 tablet (has no administration in time range)  enoxaparin (LOVENOX) injection 40 mg (40 mg Subcutaneous Given 04/20/18 1928)  atorvastatin (LIPITOR) tablet 80 mg (80 mg Oral Not Given 04/20/18 1928)  nicotine (NICODERM CQ - dosed in mg/24 hours) patch 21 mg (0 mg Transdermal Hold 04/20/18 1934)  aspirin chewable tablet 324 mg (324 mg Oral Given 04/20/18 1503)    Mobility walks

## 2018-04-20 NOTE — ED Notes (Signed)
Patient transported to MRI 

## 2018-04-20 NOTE — H&P (Signed)
History and Physical    Dennis Harmon  TML:[id number]  DOB: Apr 23, 1951  DOA: 04/20/2018 PCP: Patient, No Pcp Per   Patient coming from: home  Chief Complaint: felt like I was having a stroke  HPI: Dennis Harmon is a 67 y.o. male with medical history of NSTEMI/ CAD, HLD, tobacco abuse who comes in today because soon after he woke up, he noticed that his hands and feel were not 'working right'. He states he is having trouble explaining what was wrong. When asked about slurred speech, he states he noted it later in the day but it has improved. No visual changes and no focal numbness or tingling.   When dicussing his medications, he tells me he stopped them about 1 yr ago. He has not been back to the cardiologist and does not have a PCP.   He has a growth under his left eye for about 1 yr and and figured we would check it out while he is here.    ED Course: CT head negative   Review of Systems:  All other systems reviewed and apart from HPI, are negative.  Past Medical History:  Diagnosis Date  . Coronary artery disease    NSTEMI 9/12:  cath 01/31/11: EF 60%, Inf HK, mRCA 30-40%, LAD ok, mCFX 50%, OM2 occluded (likely > 24hr with Timi 1 flow).  Medical Tx was recommended and PCI would be deferred for recurrent ischemia.  Echo 01/31/11: mild LVH, EF 55%, AL HK, grade 1 diast dysfxn, PASP 27-31  . HTN (hypertension)   . Hyperlipidemia   . Tobacco abuse     Past Surgical History:  Procedure Laterality Date  . CARDIAC CATHETERIZATION  01/31/11  . THUMB SURGERY     RIGHT THUMB LIGAMENT SURGERY    Social History:   reports that he has been smoking cigarettes. He has a 45.00 pack-year smoking history. He does not have any smokeless tobacco history on file. He reports that he drinks alcohol. He reports that he does not use drugs.  No Known Allergies  Family History  Problem Relation Age of Onset  . Stroke Neg Hx        UNCLE HAD CAD AND HAD A CVA     Prior to Admission medications     Medication Sig Start Date End Date Taking? Authorizing Provider  ibuprofen (ADVIL,MOTRIN) 200 MG tablet Take 400 mg by mouth daily as needed for mild pain (sleep).    Yes [provider]  atorvastatin (LIPITOR) 40 MG tablet Take 1 tablet (40 mg total) by mouth daily. At bedtime Patient not taking: Reported on 04/20/2018 09/17/11   Bensimhon, Shaune Pascal, MD  isosorbide mononitrate (IMDUR) 30 MG 24 hr tablet Take 1 tablet (30 mg total) by mouth daily. Patient not taking: Reported on 04/20/2018 02/21/11 02/21/12  Larey Dresser, MD  lisinopril (PRINIVIL,ZESTRIL) 5 MG tablet Take 1 tablet (5 mg total) by mouth daily. Patient not taking: Reported on 04/20/2018 10/19/11   Bensimhon, Shaune Pascal, MD  metoprolol tartrate (LOPRESSOR) 25 MG tablet Take 2 tablets (50 mg total) by mouth 2 (two) times daily. Patient not taking: Reported on 04/20/2018 02/18/11   Richardson Dopp T, PA-C    Physical Exam: Wt Readings from Last 3 Encounters:  04/20/18 86.2 kg  03/13/11 84.8 kg  02/18/11 84.4 kg   Vitals:   04/20/18 1259 04/20/18 1500 04/20/18 1628  BP: (!) 107/96  (!) 142/90  Pulse: 79 93 76  Resp: 16 (!) 21 18  Temp:  98.4 F (36.9 C)    TempSrc: Oral    SpO2: 100% 99% 100%  Weight: 86.2 kg    Height: 5\' 6"  (1.676 m)        Constitutional:  Calm & comfortable Eyes: PERRLA, lids and conjunctivae normal- conical shaped growth under left eye ENT:  Mucous membranes are moist.  Pharynx clear of exudate   Normal dentition.  Neck: Supple, no masses  Respiratory:  Clear to auscultation bilaterally  Normal respiratory effort.  Cardiovascular:  S1 & S2 heard, regular rate and rhythm No Murmurs Abdomen:  Non distended No tenderness, No masses Bowel sounds normal Extremities:  No clubbing / cyanosis No pedal edema No joint deformity    Skin:  No rashes, lesions or ulcers Neurologic:  AAO x 3, pronounced left facial droop, left arm and leg strength 4/5 Sensation intact Psychiatric:  Normal  Mood and affect    Labs on Admission: I have personally reviewed following labs and imaging studies  CBC: Recent Labs  Lab 04/20/18 1354 04/20/18 1400  WBC 7.2  7.4  --   NEUTROABS 3.3  --   HGB 13.4  13.4 15.0  HCT 40.9  41.6 44.0  MCV 98.3  97.9  --   PLT 349  340  --    Basic Metabolic Panel: Recent Labs  Lab 04/20/18 1354 04/20/18 1400  NA 139 139  K 4.1 4.1  CL 106 106  CO2 24  --   GLUCOSE 91 87  BUN 15 14  CREATININE 0.92  0.87 0.80  CALCIUM 9.6  --    GFR: Estimated Creatinine Clearance: 93.5 mL/min (by C-G formula based on SCr of 0.8 mg/dL). Liver Function Tests: Recent Labs  Lab 04/20/18 1354  AST 27  ALT 22  ALKPHOS 82  BILITOT 1.1  PROT 8.9*  ALBUMIN 4.8   No results for input(s): LIPASE, AMYLASE in the last 168 hours. No results for input(s): AMMONIA in the last 168 hours. Coagulation Profile: Recent Labs  Lab 04/20/18 1354  INR 0.97   Cardiac Enzymes: No results for input(s): CKTOTAL, CKMB, CKMBINDEX, TROPONINI in the last 168 hours. BNP (last 3 results) No results for input(s): PROBNP in the last 8760 hours. HbA1C: No results for input(s): HGBA1C in the last 72 hours. CBG: Recent Labs  Lab 04/20/18 1426  GLUCAP 78   Lipid Profile: No results for input(s): CHOL, HDL, LDLCALC, TRIG, CHOLHDL, LDLDIRECT in the last 72 hours. Thyroid Function Tests: No results for input(s): TSH, T4TOTAL, FREET4, T3FREE, THYROIDAB in the last 72 hours. Anemia Panel: No results for input(s): VITAMINB12, FOLATE, FERRITIN, TIBC, IRON, RETICCTPCT in the last 72 hours. Urine analysis: No results found for: COLORURINE, APPEARANCEUR, LABSPEC, PHURINE, GLUCOSEU, HGBUR, BILIRUBINUR, KETONESUR, PROTEINUR, UROBILINOGEN, NITRITE, LEUKOCYTESUR Sepsis Labs: @LABRCNTIP (procalcitonin:4,lacticidven:4) )No results found for this or any previous visit (from the past 240 hour(s)).   Radiological Exams on Admission: Ct Head Wo Contrast  Result Date:  04/20/2018 CLINICAL DATA:  67 year old male awoke this morning with unsteady gait and bilateral leg weakness and slurred speech. Left facial droop. Decrease left hand grip. 51 Initial encounter. EXAM: CT HEAD WITHOUT CONTRAST TECHNIQUE: Contiguous axial images were obtained from the base of the skull through the vertex without intravenous contrast. COMPARISON:  None. FINDINGS: Brain: No intracranial hemorrhage or CT evidence of large acute infarct. MR can be obtained if infarct remains of high clinical concern. No intracranial mass lesion noted on this unenhanced exam. Vascular: Calcified plaque cavernous segment internal carotid artery bilaterally. No  acute hyperdense vessel. Skull: No acute abnormality. Sinuses/Orbits: Exophthalmos. Lateral to the inferior aspect of the left orbit is a 1.2 cm rounded low-density structure of indeterminate etiology. Clinical correlation recommended. Visualized paranasal sinuses are clear. Other: Mastoid air cells and middle ear cavities are clear. IMPRESSION: 1. No intracranial hemorrhage or CT evidence of large acute infarct. 2. Calcified plaque cavernous segment internal carotid artery bilaterally. 3. Exophthalmos. Lateral to the inferior aspect of the left orbit is a 1.2 cm rounded low-density structure of indeterminate etiology. Clinical correlation recommended. Electronically Signed   By: Genia Del M.D.   On: 04/20/2018 13:54   Mr Brain Wo Contrast  Result Date: 04/20/2018 CLINICAL DATA:  67 y/o M; woke with unsteady gait, bilateral leg weakness, and slurred speech. Left facial droop and left-sided weakness. EXAM: MRI HEAD WITHOUT CONTRAST MRA HEAD WITHOUT CONTRAST TECHNIQUE: Multiplanar, multiecho pulse sequences of the brain and surrounding structures were obtained without intravenous contrast. Angiographic images of the head were obtained using MRA technique without contrast. COMPARISON:  04/20/2018 CT head. FINDINGS: MRI HEAD FINDINGS Brain: 13 x 8 mm focus of  reduced diffusion (series 3, image 20) in the right paramedian hemi pons compatible with acute/early subacute infarction. No associated hemorrhage or mass effect. No extra-axial collection, hydrocephalus, mass effect, or herniation. Vascular: Normal flow voids. Skull and upper cervical spine: Normal marrow signal. Sinuses/Orbits: Small mucous retention cyst within the right maxillary sinus. No additional abnormal signal of the paranasal sinuses or mastoid air cells. Orbits are unremarkable. Other: Left inferolateral periorbital dermal cyst. MRA HEAD FINDINGS Internal carotid arteries: Patent. Mild non stenotic irregularity of the carotid siphons compatible with atherosclerotic disease. Anterior cerebral arteries:  Patent. Middle cerebral arteries: Patent. Anterior communicating artery: Patent. Posterior communicating arteries: Not identified, likely hypoplastic or absent. Posterior cerebral arteries:  Patent. Basilar artery:  Patent. Vertebral arteries:  Patent. No evidence of high-grade stenosis, large vessel occlusion, or aneurysm. IMPRESSION: 1. 13 mm acute/early subacute infarction in the right paramedian pons. No associated hemorrhage or mass effect. 2. Patent anterior and posterior intracranial circulation. No large vessel occlusion, aneurysm, or significant stenosis is identified. These results will be called to the ordering clinician or representative by the Radiologist Assistant, and communication documented in the PACS or zVision Dashboard. Electronically Signed   By: Kristine Garbe M.D.   On: 04/20/2018 16:20   Mr Jodene Nam Head Wo Contrast  Result Date: 04/20/2018 CLINICAL DATA:  67 y/o M; woke with unsteady gait, bilateral leg weakness, and slurred speech. Left facial droop and left-sided weakness. EXAM: MRI HEAD WITHOUT CONTRAST MRA HEAD WITHOUT CONTRAST TECHNIQUE: Multiplanar, multiecho pulse sequences of the brain and surrounding structures were obtained without intravenous contrast.  Angiographic images of the head were obtained using MRA technique without contrast. COMPARISON:  04/20/2018 CT head. FINDINGS: MRI HEAD FINDINGS Brain: 13 x 8 mm focus of reduced diffusion (series 3, image 20) in the right paramedian hemi pons compatible with acute/early subacute infarction. No associated hemorrhage or mass effect. No extra-axial collection, hydrocephalus, mass effect, or herniation. Vascular: Normal flow voids. Skull and upper cervical spine: Normal marrow signal. Sinuses/Orbits: Small mucous retention cyst within the right maxillary sinus. No additional abnormal signal of the paranasal sinuses or mastoid air cells. Orbits are unremarkable. Other: Left inferolateral periorbital dermal cyst. MRA HEAD FINDINGS Internal carotid arteries: Patent. Mild non stenotic irregularity of the carotid siphons compatible with atherosclerotic disease. Anterior cerebral arteries:  Patent. Middle cerebral arteries: Patent. Anterior communicating artery: Patent. Posterior communicating arteries: Not identified,  likely hypoplastic or absent. Posterior cerebral arteries:  Patent. Basilar artery:  Patent. Vertebral arteries:  Patent. No evidence of high-grade stenosis, large vessel occlusion, or aneurysm. IMPRESSION: 1. 13 mm acute/early subacute infarction in the right paramedian pons. No associated hemorrhage or mass effect. 2. Patent anterior and posterior intracranial circulation. No large vessel occlusion, aneurysm, or significant stenosis is identified. These results will be called to the ordering clinician or representative by the Radiologist Assistant, and communication documented in the PACS or zVision Dashboard. Electronically Signed   By: Kristine Garbe M.D.   On: 04/20/2018 16:20    EKG: Independently reviewed. Sinus rhythm  Assessment/Plan Principal Problem:   CVA (cerebral vascular accident) - left facial droop and left arm and leg weakness - acute ischemic infarct - MRI> 13 mm  acute/early subacute infarction in the right paramedian pons. - MRA shows patent vessels - f/u remaining stroke work up and therapy evals - transfer to [facility] for neurologist eval- have discussed with Dr Malen Gauze that he will be going to [facility]  Active Problems:   CAD (coronary artery disease) - resume Lipitor - will start 80 mg due to CVA - I have counseled him on making sure not to stop his meds and following up properly - hold off on Imdur and Metoprolol in setting of acute CVA to allow permissive HTN    Hypertension   Hyperlipidemia - see above    Tobacco abuse - I have counseled him- will order a nicotine patch    Non-compliance NOTE: will need a PCP as well.    DVT prophylaxis: SCDs  Code Status: Full code  Family Communication:   Disposition Plan: transfer to [facility] called: neuro  Admission status: inpatient    Debbe Odea MD [facility] Pager: [email or web address] Password [email or web address] 7PM-7AM, please contact night-coverage   04/20/2018, 5:10 PM

## 2018-04-21 ENCOUNTER — Encounter (HOSPITAL_COMMUNITY): Payer: Self-pay | Admitting: *Deleted

## 2018-04-21 ENCOUNTER — Inpatient Hospital Stay (HOSPITAL_COMMUNITY): Payer: PPO

## 2018-04-21 ENCOUNTER — Other Ambulatory Visit (HOSPITAL_COMMUNITY): Payer: PPO

## 2018-04-21 DIAGNOSIS — I6302 Cerebral infarction due to thrombosis of basilar artery: Secondary | ICD-10-CM

## 2018-04-21 DIAGNOSIS — I6789 Other cerebrovascular disease: Secondary | ICD-10-CM

## 2018-04-21 LAB — LIPID PANEL
Cholesterol: 213 mg/dL — ABNORMAL HIGH (ref 0–200)
HDL: 32 mg/dL — ABNORMAL LOW (ref >40)
LDL Cholesterol: 132 mg/dL — ABNORMAL HIGH (ref 0–99)
Total CHOL/HDL Ratio: 6.7 RATIO
Triglycerides: 246 mg/dL — ABNORMAL HIGH (ref <150)
VLDL: 49 mg/dL — AB (ref 0–40)

## 2018-04-21 LAB — ECHOCARDIOGRAM COMPLETE
Height: 66 in
Weight: 2938.29 oz

## 2018-04-21 LAB — HEMOGLOBIN A1C
Hgb A1c MFr Bld: 4.9 % (ref 4.8–5.6)
Mean Plasma Glucose: 93.93 mg/dL

## 2018-04-21 LAB — HIV ANTIBODY (ROUTINE TESTING W REFLEX): HIV Screen 4th Generation wRfx: NONREACTIVE

## 2018-04-21 LAB — RAPID URINE DRUG SCREEN, HOSP PERFORMED
Amphetamines: NOT DETECTED
Barbiturates: NOT DETECTED
Benzodiazepines: NOT DETECTED
Cocaine: NOT DETECTED
Opiates: NOT DETECTED
Tetrahydrocannabinol: NOT DETECTED

## 2018-04-21 MED ORDER — CLOPIDOGREL BISULFATE 75 MG PO TABS
75.0000 mg | ORAL_TABLET | Freq: Every day | ORAL | Status: DC
  Administered 2018-04-22: 75 mg via ORAL
  Filled 2018-04-21: qty 1

## 2018-04-21 MED ORDER — ASPIRIN EC 81 MG PO TBEC
81.0000 mg | DELAYED_RELEASE_TABLET | Freq: Every day | ORAL | Status: DC
  Administered 2018-04-21 – 2018-04-22 (×2): 81 mg via ORAL
  Filled 2018-04-21 (×2): qty 1

## 2018-04-21 MED ORDER — CLOPIDOGREL BISULFATE 75 MG PO TABS
300.0000 mg | ORAL_TABLET | Freq: Once | ORAL | Status: AC
  Administered 2018-04-21: 300 mg via ORAL
  Filled 2018-04-21: qty 4

## 2018-04-21 MED ORDER — ATORVASTATIN CALCIUM 40 MG PO TABS
40.0000 mg | ORAL_TABLET | Freq: Every day | ORAL | Status: DC
  Administered 2018-04-21: 40 mg via ORAL
  Filled 2018-04-21: qty 1

## 2018-04-21 NOTE — Care Management Note (Signed)
Case Management Note  Patient Details  Name: Dennis Harmon MRN: [id number] Date of Birth: 11-04-1950  Subjective/Objective:     Pt admitted with a stroke. He is from home with his mother. Pt states she is able to provide assistance.  DME: none Pt not taking medications at home. He doesn't feel he will have any issues with d/c meds.  Pt currently drives.  No PCP.               Action/Plan: CM was able to obtain him an appointment at [facility]. Information on the AVS. OT recommending Outpatient therapy. Awaiting PT eval. CM following.  Expected Discharge Date:                  Expected Discharge Plan:     In-House Referral:     Discharge planning Services     Post Acute Care Choice:    Choice offered to:     DME Arranged:    DME Agency:     HH Arranged:    HH Agency:     Status of Service:  In process, will continue to follow  If discussed at Long Length of Stay Meetings, dates discussed:    Additional Comments:  Pollie Friar, RN 04/21/2018, 12:00 PM

## 2018-04-21 NOTE — Progress Notes (Addendum)
STROKE TEAM PROGRESS NOTE   INTERVAL HISTORY No family is at the bedside.  He feels he is doing the same as yesterday. Workup completed; test results pending.  Vitals:   04/21/18 0320 04/21/18 0813 04/21/18 1220 04/21/18 1526  BP: 140/62 (!) 144/78 (!) 149/86 (!) 157/99  Pulse: 77 76 83 82  Resp: 18 16 18 18   Temp: 97.9 F (36.6 C) 98 F (36.7 C) 98 F (36.7 C) 98 F (36.7 C)  TempSrc: Oral Oral Oral Oral  SpO2: 98% 98% 99% 100%  Weight:      Height:        CBC:  Recent Labs  Lab 04/20/18 1354 04/20/18 1400  WBC 7.2  7.4  --   NEUTROABS 3.3  --   HGB 13.4  13.4 15.0  HCT 40.9  41.6 44.0  MCV 98.3  97.9  --   PLT 349  340  --     Basic Metabolic Panel:  Recent Labs  Lab 04/20/18 1354 04/20/18 1400  NA 139 139  K 4.1 4.1  CL 106 106  CO2 24  --   GLUCOSE 91 87  BUN 15 14  CREATININE 0.92  0.87 0.80  CALCIUM 9.6  --    Lipid Panel:     Component Value Date/Time   CHOL 213 (H) 04/21/2018 1527   TRIG 246 (H) 04/21/2018 1527   HDL 32 (L) 04/21/2018 1527   CHOLHDL 6.7 04/21/2018 1527   VLDL 49 (H) 04/21/2018 1527   LDLCALC 132 (H) 04/21/2018 1527   HgbA1c:  Lab Results  Component Value Date   HGBA1C 4.9 04/21/2018   Urine Drug Screen:     Component Value Date/Time   LABOPIA NONE DETECTED 04/21/2018 0923   COCAINSCRNUR NONE DETECTED 04/21/2018 0923   LABBENZ NONE DETECTED 04/21/2018 0923   AMPHETMU NONE DETECTED 04/21/2018 0923   THCU NONE DETECTED 04/21/2018 0923   LABBARB NONE DETECTED 04/21/2018 0923    Alcohol Level No results found for: ETH  IMAGING Ct Head Wo Contrast  Result Date: 04/20/2018 CLINICAL DATA:  67 year old male awoke this morning with unsteady gait and bilateral leg weakness and slurred speech. Left facial droop. Decrease left hand grip. 51 Initial encounter. EXAM: CT HEAD WITHOUT CONTRAST TECHNIQUE: Contiguous axial images were obtained from the base of the skull through the vertex without intravenous contrast.  COMPARISON:  None. FINDINGS: Brain: No intracranial hemorrhage or CT evidence of large acute infarct. MR can be obtained if infarct remains of high clinical concern. No intracranial mass lesion noted on this unenhanced exam. Vascular: Calcified plaque cavernous segment internal carotid artery bilaterally. No acute hyperdense vessel. Skull: No acute abnormality. Sinuses/Orbits: Exophthalmos. Lateral to the inferior aspect of the left orbit is a 1.2 cm rounded low-density structure of indeterminate etiology. Clinical correlation recommended. Visualized paranasal sinuses are clear. Other: Mastoid air cells and middle ear cavities are clear. IMPRESSION: 1. No intracranial hemorrhage or CT evidence of large acute infarct. 2. Calcified plaque cavernous segment internal carotid artery bilaterally. 3. Exophthalmos. Lateral to the inferior aspect of the left orbit is a 1.2 cm rounded low-density structure of indeterminate etiology. Clinical correlation recommended. Electronically Signed   By: Genia Del M.D.   On: 04/20/2018 13:54   Mr Brain Wo Contrast  Result Date: 04/20/2018 CLINICAL DATA:  67 y/o M; woke with unsteady gait, bilateral leg weakness, and slurred speech. Left facial droop and left-sided weakness. EXAM: MRI HEAD WITHOUT CONTRAST MRA HEAD WITHOUT CONTRAST TECHNIQUE: Multiplanar, multiecho pulse  sequences of the brain and surrounding structures were obtained without intravenous contrast. Angiographic images of the head were obtained using MRA technique without contrast. COMPARISON:  04/20/2018 CT head. FINDINGS: MRI HEAD FINDINGS Brain: 13 x 8 mm focus of reduced diffusion (series 3, image 20) in the right paramedian hemi pons compatible with acute/early subacute infarction. No associated hemorrhage or mass effect. No extra-axial collection, hydrocephalus, mass effect, or herniation. Vascular: Normal flow voids. Skull and upper cervical spine: Normal marrow signal. Sinuses/Orbits: Small mucous retention  cyst within the right maxillary sinus. No additional abnormal signal of the paranasal sinuses or mastoid air cells. Orbits are unremarkable. Other: Left inferolateral periorbital dermal cyst. MRA HEAD FINDINGS Internal carotid arteries: Patent. Mild non stenotic irregularity of the carotid siphons compatible with atherosclerotic disease. Anterior cerebral arteries:  Patent. Middle cerebral arteries: Patent. Anterior communicating artery: Patent. Posterior communicating arteries: Not identified, likely hypoplastic or absent. Posterior cerebral arteries:  Patent. Basilar artery:  Patent. Vertebral arteries:  Patent. No evidence of high-grade stenosis, large vessel occlusion, or aneurysm. IMPRESSION: 1. 13 mm acute/early subacute infarction in the right paramedian pons. No associated hemorrhage or mass effect. 2. Patent anterior and posterior intracranial circulation. No large vessel occlusion, aneurysm, or significant stenosis is identified. These results will be called to the ordering clinician or representative by the Radiologist Assistant, and communication documented in the PACS or zVision Dashboard. Electronically Signed   By: Kristine Garbe M.D.   On: 04/20/2018 16:20   Mr Jodene Nam Head Wo Contrast  Result Date: 04/20/2018 CLINICAL DATA:  67 y/o M; woke with unsteady gait, bilateral leg weakness, and slurred speech. Left facial droop and left-sided weakness. EXAM: MRI HEAD WITHOUT CONTRAST MRA HEAD WITHOUT CONTRAST TECHNIQUE: Multiplanar, multiecho pulse sequences of the brain and surrounding structures were obtained without intravenous contrast. Angiographic images of the head were obtained using MRA technique without contrast. COMPARISON:  04/20/2018 CT head. FINDINGS: MRI HEAD FINDINGS Brain: 13 x 8 mm focus of reduced diffusion (series 3, image 20) in the right paramedian hemi pons compatible with acute/early subacute infarction. No associated hemorrhage or mass effect. No extra-axial collection,  hydrocephalus, mass effect, or herniation. Vascular: Normal flow voids. Skull and upper cervical spine: Normal marrow signal. Sinuses/Orbits: Small mucous retention cyst within the right maxillary sinus. No additional abnormal signal of the paranasal sinuses or mastoid air cells. Orbits are unremarkable. Other: Left inferolateral periorbital dermal cyst. MRA HEAD FINDINGS Internal carotid arteries: Patent. Mild non stenotic irregularity of the carotid siphons compatible with atherosclerotic disease. Anterior cerebral arteries:  Patent. Middle cerebral arteries: Patent. Anterior communicating artery: Patent. Posterior communicating arteries: Not identified, likely hypoplastic or absent. Posterior cerebral arteries:  Patent. Basilar artery:  Patent. Vertebral arteries:  Patent. No evidence of high-grade stenosis, large vessel occlusion, or aneurysm. IMPRESSION: 1. 13 mm acute/early subacute infarction in the right paramedian pons. No associated hemorrhage or mass effect. 2. Patent anterior and posterior intracranial circulation. No large vessel occlusion, aneurysm, or significant stenosis is identified. These results will be called to the ordering clinician or representative by the Radiologist Assistant, and communication documented in the PACS or zVision Dashboard. Electronically Signed   By: Kristine Garbe M.D.   On: 04/20/2018 16:20   Vas US Carotid (at [facility] Only)  Result Date: 04/21/2018 Carotid Arterial Duplex Study Indications: CVA. Performing Technologist: Toma Copier RVS  Examination Guidelines: A complete evaluation includes B-mode imaging, spectral Doppler, color Doppler, and power Doppler as needed of all accessible portions of each vessel.  Bilateral testing is considered an integral part of a complete examination. Limited examinations for reoccurring indications may be performed as noted.  Right Carotid Findings:  +----------+--------+--------+--------+------------+-----------------------+           PSV cm/sEDV cm/sStenosisDescribe    Comments                +----------+--------+--------+--------+------------+-----------------------+ CCA Prox  98      19                          mild intimal thickening +----------+--------+--------+--------+------------+-----------------------+ CCA Distal83      19                          mild intimal thickening +----------+--------+--------+--------+------------+-----------------------+ ICA Prox  105     22              heterogenousmild plaque             +----------+--------+--------+--------+------------+-----------------------+ ICA Mid   88      25                                                  +----------+--------+--------+--------+------------+-----------------------+ ICA Distal96      32                          tortuous                +----------+--------+--------+--------+------------+-----------------------+ ECA       95      14              heterogenousmild plaque             +----------+--------+--------+--------+------------+-----------------------+ +----------+--------+-------+--------+-------------------+           PSV cm/sEDV cmsDescribeArm Pressure (mmHG) +----------+--------+-------+--------+-------------------+ Subclavian132                                        +----------+--------+-------+--------+-------------------+ +---------+--------+--+--------+--+ VertebralPSV cm/s66EDV cm/s17 +---------+--------+--+--------+--+  Left Carotid Findings: +----------+--------+--------+--------+------------+-----------------------+           PSV cm/sEDV cm/sStenosisDescribe    Comments                +----------+--------+--------+--------+------------+-----------------------+ CCA Prox  101     15                          minimal wall thickening  +----------+--------+--------+--------+------------+-----------------------+ CCA Distal96      17                          mild intimal thickening +----------+--------+--------+--------+------------+-----------------------+ ICA Prox  78      24              heterogenousmild plaque             +----------+--------+--------+--------+------------+-----------------------+ ICA Mid   58      19                                                  +----------+--------+--------+--------+------------+-----------------------+  ICA Distal83      26                          tortuous                +----------+--------+--------+--------+------------+-----------------------+ ECA       82      16              heterogenousminimal plaque          +----------+--------+--------+--------+------------+-----------------------+ +----------+--------+--------+--------+-------------------+ SubclavianPSV cm/sEDV cm/sDescribeArm Pressure (mmHG) +----------+--------+--------+--------+-------------------+           84                                          +----------+--------+--------+--------+-------------------+ +---------+--------+--+--------+--+ VertebralPSV cm/s67EDV cm/s19 +---------+--------+--+--------+--+  Summary: Right Carotid: Velocities in the right ICA are consistent with a 1-39% stenosis. Left Carotid: Velocities in the left ICA are consistent with a 1-39% stenosis. Vertebrals:  Bilateral vertebral arteries demonstrate antegrade flow. Subclavians: Normal flow hemodynamics were seen in bilateral subclavian              arteries. *See table(s) above for measurements and observations.     Preliminary    2D Echocardiogram  - Left ventricle: The cavity size was normal. Wall thickness was increased in a pattern of mild LVH. Systolic function was normal. The estimated ejection fraction was in the range of 55% to 60%. Wall motion was normal; there were no regional wall motion  abnormalities. Doppler parameters are consistent with abnormal left ventricular relaxation (grade 1 diastolic dysfunction). Impressions:  - Normal LV systolic function; mild diastolic dysfunction; mild LVH.   PHYSICAL EXAM General: Appears well-developed  Psych: Affect appropriate to situation Eyes: No scleral injection HENT: No OP obstrucion Head: Normocephalic.  Cardiovascular: Normal rate and regular rhythm.  Respiratory: Effort normal and breath sounds normal to anterior ascultation Skin: WDI  Neurological Examination Mental Status: Alert, oriented, thought content appropriate.  Speech fluent without evidence of aphasia. Able to follow 3 step commands without difficulty. Cranial Nerves: II: Visual fields grossly normal,  III,IV, VI: ptosis not present, extra-ocular motions intact bilaterally, pupils equal, round, reactive to light and accommodation V,VII: smile symmetric, facial light touch sensation normal bilaterally VIII: hearing normal bilaterally IX,X: uvula rises symmetrically XI: bilateral shoulder shrug XII: tongue deviates to the L Motor: Right :  Upper extremity   5/5                                      Left:     Upper extremity   4/5             Lower extremity   5/5                                                  Lower extremity   4+/5 Tone and bulk:normal tone throughout; no atrophy noted Sensory: Pinprick and light touch intact throughout, bilaterally Plantars: Right: downgoing                                Left:  downgoing Cerebellar: Mild dysmetria on finger to nose testing in left upper extremity Gait: mild gait instability    ASSESSMENT/PLAN [name] is a 67 y.o. male with history of HTN, HLD, CAD and tobacco abuse presenting with sudden onset slurred speech and gait instability.   Stroke:  right paramedian pontine infarct secondary to small vessel disease source  CT head No acute stroke. Calcified plaque B ICAs. exophthalmos - L orbit w/ 1.2 cm  rounded low-density structure  MRI  right paramedian pontine infarct   MRA  Patent vasculature  Carotid Doppler  B ICA 1-39% stenosis, VAs antegrade   2D Echo  EF 55-60%. No source of embolus   LDL 132  HIV neg  UDS neg  HgbA1c 4.9  Lovenox 40 mg sq daily for VTE prophylaxis  No antithrombotic prior to admission, now on aspirin 81 mg daily and clopidogrel 75 mg daily following aspirin and plavix load. Continue DAPT x 3 weeks then ASPIRIN alone  Therapy recommendations:  OP OT  Disposition:  Return home  Hypertension  Stable . Permissive hypertension (OK if < 220/120) but gradually normalize in 5-7 days . Long-term BP goal normotensive  Hyperlipidemia  Home meds:  lipitor 40 - but was not taking  increased to Lipitor 80 on arrival to hospital  LDL 132, goal < 70  Will decrease dose back to 40  Continue statin at discharge  Other Stroke Risk Factors  Advanced age  Cigarette smoker, advised to stop smoking  Family hx stroke (uncle)  Coronary artery disease  Other Active Problems  Hx non-compliance  NOTHING FURTHER TO ADD FROM THE STROKE STANDPOINT  Stroke Service will sign off. Please call should any needs arise.  Follow-up Stroke Clinic at [facility] in 4 weeks, order placed.   Hospital day # 1  Burnetta Sabin, MSN, APRN, ANVP-BC, AGPCNP-BC Advanced Practice Stroke Nurse [place] for Schedule & Pager information 04/21/2018 4:35 PM   ATTENDING NOTE: I reviewed above note and agree with the assessment and plan. Pt was seen and examined.   67 year old male with history of CAD, HTN, HLD, smoker admitted for slurred speech and difficulty walking.  MRI showed right pontine infarct.  MRA unremarkable.  Carotid Doppler and 2D echo unremarkable.  LDL 132 and A1c 4.9 and UDS negative.  Patient stroke likely due to multiple stroke risk factors including CAD, hypertension, hyperlipidemia and smoking.  Recommend  aspirin 81 and Plavix 75 DAPT for 3 weeks and then aspirin alone.  Continue Lipitor for stroke prevention.  Educated on stroke risk factor modification, patient willing to quit smoking.  PT/OT recommend outpatient PT/OT.  Neurology will sign off. Please call with questions. Pt will follow up with stroke clinic NP at [facility] in about 4 weeks. Thanks for the consult.   Rosalin Hawking, MD PhD Stroke Neurology 04/21/2018 6:11 PM     To contact Stroke Continuity provider, please refer to [email or web address] After hours, contact General Neurology

## 2018-04-21 NOTE — Progress Notes (Signed)
PROGRESS NOTE    Dennis Harmon  VHQ:[id number] DOB: 1951-02-23 DOA: 04/20/2018 PCP: Patient, No Pcp Per   Brief Narrative: Patient is a 67 year old male with past medical history of coronary disease, and NSTEMI, hyperlipidemia, smoker who presented to the emergency department after he noticed that his hands and feet were not working right.  He had slurred speech also.  He was not taking any medications at home and he was not following with any doctors.  Brain imagings done here showed acute ischemic stroke.  Assessment & Plan:   Principal Problem:   CVA (cerebral vascular accident) ([place]) Active Problems:   CAD (coronary artery disease)   Hypertension   Hyperlipidemia   Tobacco abuse   Non-compliance  Acute CVA: Presented with left facial droop, left arm and leg weakness.  Motor function is improved.  MRI showed acute/early subacute infarction in the right paraMedial pons.  MRA shows patent vessels.  Neurology following. Carotid Doppler did not show any significant obstruction.  This underwent echocardiogram, will follow the report.  Follow-up lipid panel hemoglobin A1c. Patient seen by occupational therapy and recommended outpatient outpatient therapy.  History of coronary disease: Patient on not any medication at home.  He was following with cardiology in the past but not now.  Started on Lipitor 80 mg.  He was supposed to be on Imdur and metoprolol at home but they are on hold to allow for permissive hypertension.  Tobacco abuse: Smokes a pack a day.  Order nicotine patch ordered.  Counseled for cessation.  Noncompliance issues: Counseled for importance of being compliant to prevent further morbidities.  We have also requested case manager to arrange a follow-up with the PCP ,it has been done already.      DVT prophylaxis:SCD Code Status: Full Family Communication: None present at the bed side Disposition Plan: Likely home tomorrow   Consultants:  Neurology  Procedures:None  Antimicrobials:None  Subjective: Patient seen and examined the bedside this afternoon.  Remains comfortable.  Willing to go home.  Denies any residual weakness, dizziness, chest pain or shortness of breath.  Objective: Vitals:   04/21/18 0120 04/21/18 0320 04/21/18 0813 04/21/18 1220  BP: 127/60 140/62 (!) 144/78 (!) 149/86  Pulse: 76 77 76 83  Resp: 18 18 16 18   Temp: 97.9 F (36.6 C) 97.9 F (36.6 C) 98 F (36.7 C) 98 F (36.7 C)  TempSrc: Oral Oral Oral Oral  SpO2: 100% 98% 98% 99%  Weight:      Height:        Intake/Output Summary (Last 24 hours) at 04/21/2018 1330 Last data filed at 04/21/2018 0800 Gross per 24 hour  Intake 240 ml  Output -  Net 240 ml   Filed Weights   04/20/18 1259 04/20/18 2320  Weight: 86.2 kg 83.3 kg    Examination:  General exam: Appears calm and comfortable ,Not in distress,average built HEENT:PERRL,Oral mucosa moist, Ear/Nose normal on gross exam,mild left facial droop Respiratory system: Bilateral equal air entry, normal vesicular breath sounds, no wheezes or crackles  Cardiovascular system: S1 & S2 heard, RRR. No JVD, murmurs, rubs, gallops or clicks. No pedal edema. Gastrointestinal system: Abdomen is nondistended, soft and nontender. No organomegaly or masses felt. Normal bowel sounds heard. Central nervous system: Alert and oriented. Motor 4/5 on the left upper extremity, weak finger grasp on the left.  Other motor exam is normal. Extremities: No edema, no clubbing ,no cyanosis, distal peripheral pulses palpable. Skin: No rashes, lesions or ulcers,no icterus ,no pallor MSK:  Normal muscle bulk,tone ,power Psychiatry: Judgement and insight appear normal. Mood & affect appropriate.     Data Reviewed: I have personally reviewed following labs and imaging studies  CBC: Recent Labs  Lab 04/20/18 1354 04/20/18 1400  WBC 7.2  7.4  --   NEUTROABS 3.3  --   HGB 13.4  13.4 15.0  HCT 40.9  41.6 44.0   MCV 98.3  97.9  --   PLT 349  340  --    Basic Metabolic Panel: Recent Labs  Lab 04/20/18 1354 04/20/18 1400  NA 139 139  K 4.1 4.1  CL 106 106  CO2 24  --   GLUCOSE 91 87  BUN 15 14  CREATININE 0.92  0.87 0.80  CALCIUM 9.6  --    GFR: Estimated Creatinine Clearance: 92 mL/min (by C-G formula based on SCr of 0.8 mg/dL). Liver Function Tests: Recent Labs  Lab 04/20/18 1354  AST 27  ALT 22  ALKPHOS 82  BILITOT 1.1  PROT 8.9*  ALBUMIN 4.8   No results for input(s): LIPASE, AMYLASE in the last 168 hours. No results for input(s): AMMONIA in the last 168 hours. Coagulation Profile: Recent Labs  Lab 04/20/18 1354  INR 0.97   Cardiac Enzymes: No results for input(s): CKTOTAL, CKMB, CKMBINDEX, TROPONINI in the last 168 hours. BNP (last 3 results) No results for input(s): PROBNP in the last 8760 hours. HbA1C: No results for input(s): HGBA1C in the last 72 hours. CBG: Recent Labs  Lab 04/20/18 1426  GLUCAP 78   Lipid Profile: No results for input(s): CHOL, HDL, LDLCALC, TRIG, CHOLHDL, LDLDIRECT in the last 72 hours. Thyroid Function Tests: No results for input(s): TSH, T4TOTAL, FREET4, T3FREE, THYROIDAB in the last 72 hours. Anemia Panel: No results for input(s): VITAMINB12, FOLATE, FERRITIN, TIBC, IRON, RETICCTPCT in the last 72 hours. Sepsis Labs: No results for input(s): PROCALCITON, LATICACIDVEN in the last 168 hours.  No results found for this or any previous visit (from the past 240 hour(s)).       Radiology Studies: Ct Head Wo Contrast  Result Date: 04/20/2018 CLINICAL DATA:  67 year old male awoke this morning with unsteady gait and bilateral leg weakness and slurred speech. Left facial droop. Decrease left hand grip. 51 Initial encounter. EXAM: CT HEAD WITHOUT CONTRAST TECHNIQUE: Contiguous axial images were obtained from the base of the skull through the vertex without intravenous contrast. COMPARISON:  None. FINDINGS: Brain: No intracranial  hemorrhage or CT evidence of large acute infarct. MR can be obtained if infarct remains of high clinical concern. No intracranial mass lesion noted on this unenhanced exam. Vascular: Calcified plaque cavernous segment internal carotid artery bilaterally. No acute hyperdense vessel. Skull: No acute abnormality. Sinuses/Orbits: Exophthalmos. Lateral to the inferior aspect of the left orbit is a 1.2 cm rounded low-density structure of indeterminate etiology. Clinical correlation recommended. Visualized paranasal sinuses are clear. Other: Mastoid air cells and middle ear cavities are clear. IMPRESSION: 1. No intracranial hemorrhage or CT evidence of large acute infarct. 2. Calcified plaque cavernous segment internal carotid artery bilaterally. 3. Exophthalmos. Lateral to the inferior aspect of the left orbit is a 1.2 cm rounded low-density structure of indeterminate etiology. Clinical correlation recommended. Electronically Signed   By: Genia Del M.D.   On: 04/20/2018 13:54   Mr Brain Wo Contrast  Result Date: 04/20/2018 CLINICAL DATA:  67 y/o M; woke with unsteady gait, bilateral leg weakness, and slurred speech. Left facial droop and left-sided weakness. EXAM: MRI HEAD WITHOUT CONTRAST MRA  HEAD WITHOUT CONTRAST TECHNIQUE: Multiplanar, multiecho pulse sequences of the brain and surrounding structures were obtained without intravenous contrast. Angiographic images of the head were obtained using MRA technique without contrast. COMPARISON:  04/20/2018 CT head. FINDINGS: MRI HEAD FINDINGS Brain: 13 x 8 mm focus of reduced diffusion (series 3, image 20) in the right paramedian hemi pons compatible with acute/early subacute infarction. No associated hemorrhage or mass effect. No extra-axial collection, hydrocephalus, mass effect, or herniation. Vascular: Normal flow voids. Skull and upper cervical spine: Normal marrow signal. Sinuses/Orbits: Small mucous retention cyst within the right maxillary sinus. No additional  abnormal signal of the paranasal sinuses or mastoid air cells. Orbits are unremarkable. Other: Left inferolateral periorbital dermal cyst. MRA HEAD FINDINGS Internal carotid arteries: Patent. Mild non stenotic irregularity of the carotid siphons compatible with atherosclerotic disease. Anterior cerebral arteries:  Patent. Middle cerebral arteries: Patent. Anterior communicating artery: Patent. Posterior communicating arteries: Not identified, likely hypoplastic or absent. Posterior cerebral arteries:  Patent. Basilar artery:  Patent. Vertebral arteries:  Patent. No evidence of high-grade stenosis, large vessel occlusion, or aneurysm. IMPRESSION: 1. 13 mm acute/early subacute infarction in the right paramedian pons. No associated hemorrhage or mass effect. 2. Patent anterior and posterior intracranial circulation. No large vessel occlusion, aneurysm, or significant stenosis is identified. These results will be called to the ordering clinician or representative by the Radiologist Assistant, and communication documented in the PACS or zVision Dashboard. Electronically Signed   By: Kristine Garbe M.D.   On: 04/20/2018 16:20   Mr Jodene Nam Head Wo Contrast  Result Date: 04/20/2018 CLINICAL DATA:  67 y/o M; woke with unsteady gait, bilateral leg weakness, and slurred speech. Left facial droop and left-sided weakness. EXAM: MRI HEAD WITHOUT CONTRAST MRA HEAD WITHOUT CONTRAST TECHNIQUE: Multiplanar, multiecho pulse sequences of the brain and surrounding structures were obtained without intravenous contrast. Angiographic images of the head were obtained using MRA technique without contrast. COMPARISON:  04/20/2018 CT head. FINDINGS: MRI HEAD FINDINGS Brain: 13 x 8 mm focus of reduced diffusion (series 3, image 20) in the right paramedian hemi pons compatible with acute/early subacute infarction. No associated hemorrhage or mass effect. No extra-axial collection, hydrocephalus, mass effect, or herniation. Vascular:  Normal flow voids. Skull and upper cervical spine: Normal marrow signal. Sinuses/Orbits: Small mucous retention cyst within the right maxillary sinus. No additional abnormal signal of the paranasal sinuses or mastoid air cells. Orbits are unremarkable. Other: Left inferolateral periorbital dermal cyst. MRA HEAD FINDINGS Internal carotid arteries: Patent. Mild non stenotic irregularity of the carotid siphons compatible with atherosclerotic disease. Anterior cerebral arteries:  Patent. Middle cerebral arteries: Patent. Anterior communicating artery: Patent. Posterior communicating arteries: Not identified, likely hypoplastic or absent. Posterior cerebral arteries:  Patent. Basilar artery:  Patent. Vertebral arteries:  Patent. No evidence of high-grade stenosis, large vessel occlusion, or aneurysm. IMPRESSION: 1. 13 mm acute/early subacute infarction in the right paramedian pons. No associated hemorrhage or mass effect. 2. Patent anterior and posterior intracranial circulation. No large vessel occlusion, aneurysm, or significant stenosis is identified. These results will be called to the ordering clinician or representative by the Radiologist Assistant, and communication documented in the PACS or zVision Dashboard. Electronically Signed   By: Kristine Garbe M.D.   On: 04/20/2018 16:20   Vas US Carotid (at [place] Only)  Result Date: 04/21/2018 Carotid Arterial Duplex Study Indications: CVA. Performing Technologist: Toma Copier RVS  Examination Guidelines: A complete evaluation includes B-mode imaging, spectral Doppler, color Doppler, and power Doppler as needed  of all accessible portions of each vessel. Bilateral testing is considered an integral part of a complete examination. Limited examinations for reoccurring indications may be performed as noted.  Right Carotid Findings: +----------+--------+--------+--------+------------+-----------------------+           PSV cm/sEDV  cm/sStenosisDescribe    Comments                +----------+--------+--------+--------+------------+-----------------------+ CCA Prox  98      19                          mild intimal thickening +----------+--------+--------+--------+------------+-----------------------+ CCA Distal83      19                          mild intimal thickening +----------+--------+--------+--------+------------+-----------------------+ ICA Prox  105     22              heterogenousmild plaque             +----------+--------+--------+--------+------------+-----------------------+ ICA Mid   88      25                                                  +----------+--------+--------+--------+------------+-----------------------+ ICA Distal96      32                          tortuous                +----------+--------+--------+--------+------------+-----------------------+ ECA       95      14              heterogenousmild plaque             +----------+--------+--------+--------+------------+-----------------------+ +----------+--------+-------+--------+-------------------+           PSV cm/sEDV cmsDescribeArm Pressure (mmHG) +----------+--------+-------+--------+-------------------+ Subclavian132                                        +----------+--------+-------+--------+-------------------+ +---------+--------+--+--------+--+ VertebralPSV cm/s66EDV cm/s17 +---------+--------+--+--------+--+  Left Carotid Findings: +----------+--------+--------+--------+------------+-----------------------+           PSV cm/sEDV cm/sStenosisDescribe    Comments                +----------+--------+--------+--------+------------+-----------------------+ CCA Prox  101     15                          minimal wall thickening +----------+--------+--------+--------+------------+-----------------------+ CCA Distal96      17                          mild intimal thickening  +----------+--------+--------+--------+------------+-----------------------+ ICA Prox  78      24              heterogenousmild plaque             +----------+--------+--------+--------+------------+-----------------------+ ICA Mid   58      19                                                  +----------+--------+--------+--------+------------+-----------------------+  ICA Distal83      26                          tortuous                +----------+--------+--------+--------+------------+-----------------------+ ECA       82      16              heterogenousminimal plaque          +----------+--------+--------+--------+------------+-----------------------+ +----------+--------+--------+--------+-------------------+ SubclavianPSV cm/sEDV cm/sDescribeArm Pressure (mmHG) +----------+--------+--------+--------+-------------------+           84                                          +----------+--------+--------+--------+-------------------+ +---------+--------+--+--------+--+ VertebralPSV cm/s67EDV cm/s19 +---------+--------+--+--------+--+  Summary: Right Carotid: Velocities in the right ICA are consistent with a 1-39% stenosis. Left Carotid: Velocities in the left ICA are consistent with a 1-39% stenosis. Vertebrals:  Bilateral vertebral arteries demonstrate antegrade flow. Subclavians: Normal flow hemodynamics were seen in bilateral subclavian              arteries. *See table(s) above for measurements and observations.     Preliminary         Scheduled Meds: .  stroke: mapping our early stages of recovery book   Does not apply Once  . aspirin EC  81 mg Oral Daily  . atorvastatin  80 mg Oral q1800  . [START ON 04/22/2018] clopidogrel  75 mg Oral Daily  . enoxaparin (LOVENOX) injection  40 mg Subcutaneous Q24H  . nicotine  21 mg Transdermal Daily   Continuous Infusions:   LOS: 1 day    Time spent: 35 mins.More than 50% of that time was spent in  counseling and/or coordination of care.      Shelly Coss, MD [facility] Pager [phone]  If 7PM-7AM, please contact night-coverage [email or web address] Password [email or web address] 04/21/2018, 1:30 PM

## 2018-04-21 NOTE — Progress Notes (Signed)
  Echocardiogram 2D Echocardiogram has been performed.  Kiyomi Pallo G Braeden Kennan 04/21/2018, 1:34 PM

## 2018-04-21 NOTE — Evaluation (Signed)
Speech Language Pathology Evaluation Patient Details Name: Dennis Harmon MRN: [id number] DOB: 10-17-1950 Today's Date: 04/21/2018 Time: 4656-8127 SLP Time Calculation (min) (ACUTE ONLY): 10 min  Problem List:  Patient Active Problem List   Diagnosis Date Noted  . CVA (cerebral vascular accident) ([place]) 04/20/2018  . Non-compliance 04/20/2018  . Angina of effort ([place]) 02/18/2011  . CAD (coronary artery disease) 02/18/2011  . Hypertension 02/18/2011  . Hyperlipidemia 02/18/2011  . Tobacco abuse 02/18/2011   Past Medical History:  Past Medical History:  Diagnosis Date  . Coronary artery disease    NSTEMI 9/12:  cath 01/31/11: EF 60%, Inf HK, mRCA 30-40%, LAD ok, mCFX 50%, OM2 occluded (likely > 24hr with [place] 1 flow).  Medical Tx was recommended and PCI would be deferred for recurrent ischemia.  Echo 01/31/11: mild LVH, EF 55%, AL HK, grade 1 diast dysfxn, PASP 27-31  . HTN (hypertension)   . Hyperlipidemia   . Tobacco abuse    Past Surgical History:  Past Surgical History:  Procedure Laterality Date  . CARDIAC CATHETERIZATION  01/31/11  . THUMB SURGERY     RIGHT THUMB LIGAMENT SURGERY   HPI:  Dennis Harmon is a 67 y.o. male with medical history of NSTEMI/ CAD, HLD, tobacco abuse who comes in today because soon after he woke up, he noticed that his hands and feel were not 'working right'. He states he is having trouble explaining what was wrong. When asked about slurred speech, he states he noted it later in the day but it has improved. No visual changes and no focal numbness or tingling.  When dicussing his medications, he tells me he stopped them about 1 yr ago. He has not been back to the cardiologist and does not have a PCP. He underwent MRI brain at [facility] long emergency room showed right pontine infarction patient was transferred to [facility] for further evaluation.   Assessment / Plan / Recommendation Clinical Impression  Pt presents withh functional cognitive abilities and very mild  left facial weakness. Per pt report, facial weakness and slurred speech are much improved over previous day. As such, pt presents with very mildly slurred speech but is intelligible at the conversation level. Pt is able to effectively communicate in noisy environment without any issues with intelligibility. Support offered at dischrge for [facility] if facila weakness is not improved, however pt declined stating "I will be alright." Education provided on stroke prevention and s/s of stroke. ST to sign off at this time.     SLP Assessment  SLP Recommendation/Assessment: Patient does not need any further Speech Lanaguage Pathology Services SLP Visit Diagnosis: Dysarthria and anarthria (R47.1)    Follow Up Recommendations  None    Frequency and Duration           SLP Evaluation Cognition  Overall Cognitive Status: Within Functional Limits for tasks assessed Arousal/Alertness: Awake/alert Orientation Level: Oriented X4       Comprehension  Auditory Comprehension Overall Auditory Comprehension: Appears within functional limits for tasks assessed Visual Recognition/Discrimination Discrimination: Within Function Limits Reading Comprehension Reading Status: Within funtional limits    Expression Expression Primary Mode of Expression: Verbal Verbal Expression Overall Verbal Expression: Appears within functional limits for tasks assessed Written Expression Dominant Hand: Right Written Expression: Not tested   Oral / Motor  Oral Motor/Sensory Function Overall Oral Motor/Sensory Function: Mild impairment Facial ROM: Reduced left Facial Symmetry: Abnormal symmetry left Facial Strength: Reduced left Motor Speech Overall Motor Speech: Appears within functional limits for tasks assessed  Respiration: Within functional limits Phonation: Normal Resonance: Within functional limits Articulation: Within functional limitis Intelligibility: Intelligible Motor Planning: Witnin functional limits Motor  Speech Errors: Not applicable   GO                    Orlanda Lemmerman 04/21/2018, 9:58 AM

## 2018-04-21 NOTE — Progress Notes (Signed)
Bilateral carotid duplex completed - Preliminary results can be found in CV Proc. [place] Kaly Mcquary,RVS  04/21/2018,11:37 am

## 2018-04-21 NOTE — Consult Note (Signed)
Requesting Physician: Dr. Wilson Singer    Chief Complaint: Slurred speech and unsteady gait  History obtained from: Patient and Chart     HPI:                                                                                                                                       Dennis Harmon is an 67 y.o. male with past medical history of hypertension, hyperlipidemia, coronary artery disease, tobacco abuse presents to [facility] long emergency room after developing sudden onset slurred speech and gait instability the day before around 9 PM.  Patient states that he is gradually improved.  He underwent MRI brain at [facility] long emergency room showed right pontine infarction patient was transferred to [facility] for further evaluation  Date last known well: 12.1.19 Time last known well: 9pm tPA Given: no, outside window NIHSS: 2 Baseline MRS 0    Past Medical History:  Diagnosis Date  . Coronary artery disease    NSTEMI 9/12:  cath 01/31/11: EF 60%, Inf HK, mRCA 30-40%, LAD ok, mCFX 50%, OM2 occluded (likely > 24hr with Timi 1 flow).  Medical Tx was recommended and PCI would be deferred for recurrent ischemia.  Echo 01/31/11: mild LVH, EF 55%, AL HK, grade 1 diast dysfxn, PASP 27-31  . HTN (hypertension)   . Hyperlipidemia   . Tobacco abuse     Past Surgical History:  Procedure Laterality Date  . CARDIAC CATHETERIZATION  01/31/11  . THUMB SURGERY     RIGHT THUMB LIGAMENT SURGERY    Family History  Problem Relation Age of Onset  . Stroke Neg Hx        UNCLE HAD CAD AND HAD A CVA   Social History:  reports that he has been smoking cigarettes. He has a 45.00 pack-year smoking history. He does not have any smokeless tobacco history on file. He reports that he drinks alcohol. He reports that he does not use drugs.  Allergies: No Known Allergies  Medications:                                                                                                                        I reviewed home  medications   ROS:  14 systems reviewed and negative except above    Examination:                                                                                                      General: Appears well-developed  Psych: Affect appropriate to situation Eyes: No scleral injection HENT: No OP obstrucion Head: Normocephalic.  Cardiovascular: Normal rate and regular rhythm.  Respiratory: Effort normal and breath sounds normal to anterior ascultation GI: Soft.  No distension. There is no tenderness.  Skin: WDI    Neurological Examination Mental Status: Alert, oriented, thought content appropriate.  Speech fluent without evidence of aphasia. Able to follow 3 step commands without difficulty. Cranial Nerves: II: Visual fields grossly normal,  III,IV, VI: ptosis not present, extra-ocular motions intact bilaterally, pupils equal, round, reactive to light and accommodation V,VII: smile symmetric, facial light touch sensation normal bilaterally VIII: hearing normal bilaterally IX,X: uvula rises symmetrically XI: bilateral shoulder shrug XII: midline tongue extension Motor: Right : Upper extremity   5/5    Left:     Upper extremity   4/5  Lower extremity   5/5     Lower extremity   5/5 Tone and bulk:normal tone throughout; no atrophy noted Sensory: Pinprick and light touch intact throughout, bilaterally Deep Tendon Reflexes: 2+ and symmetric throughout Plantars: Right: downgoing   Left: downgoing Cerebellar: Dysmetria on finger to nose testing in left upper extremity Gait: mild gait instability      Lab Results: Basic Metabolic Panel: Recent Labs  Lab 04/20/18 1354 04/20/18 1400  NA 139 139  K 4.1 4.1  CL 106 106  CO2 24  --   GLUCOSE 91 87  BUN 15 14  CREATININE 0.92  0.87 0.80  CALCIUM 9.6  --     CBC: Recent Labs  Lab  04/20/18 1354 04/20/18 1400  WBC 7.2  7.4  --   NEUTROABS 3.3  --   HGB 13.4  13.4 15.0  HCT 40.9  41.6 44.0  MCV 98.3  97.9  --   PLT 349  340  --     Coagulation Studies: Recent Labs    04/20/18 1354  LABPROT 12.8  INR 0.97    Imaging: Ct Head Wo Contrast  Result Date: 04/20/2018 CLINICAL DATA:  67 year old male awoke this morning with unsteady gait and bilateral leg weakness and slurred speech. Left facial droop. Decrease left hand grip. 51 Initial encounter. EXAM: CT HEAD WITHOUT CONTRAST TECHNIQUE: Contiguous axial images were obtained from the base of the skull through the vertex without intravenous contrast. COMPARISON:  None. FINDINGS: Brain: No intracranial hemorrhage or CT evidence of large acute infarct. MR can be obtained if infarct remains of high clinical concern. No intracranial mass lesion noted on this unenhanced exam. Vascular: Calcified plaque cavernous segment internal carotid artery bilaterally. No acute hyperdense vessel. Skull: No acute abnormality. Sinuses/Orbits: Exophthalmos. Lateral to the inferior aspect of the left orbit is a 1.2 cm rounded low-density structure of indeterminate etiology. Clinical correlation recommended. Visualized paranasal sinuses are clear. Other: Mastoid air cells and middle ear cavities  are clear. IMPRESSION: 1. No intracranial hemorrhage or CT evidence of large acute infarct. 2. Calcified plaque cavernous segment internal carotid artery bilaterally. 3. Exophthalmos. Lateral to the inferior aspect of the left orbit is a 1.2 cm rounded low-density structure of indeterminate etiology. Clinical correlation recommended. Electronically Signed   By: Genia Del M.D.   On: 04/20/2018 13:54   Mr Brain Wo Contrast  Result Date: 04/20/2018 CLINICAL DATA:  67 y/o M; woke with unsteady gait, bilateral leg weakness, and slurred speech. Left facial droop and left-sided weakness. EXAM: MRI HEAD WITHOUT CONTRAST MRA HEAD WITHOUT CONTRAST TECHNIQUE:  Multiplanar, multiecho pulse sequences of the brain and surrounding structures were obtained without intravenous contrast. Angiographic images of the head were obtained using MRA technique without contrast. COMPARISON:  04/20/2018 CT head. FINDINGS: MRI HEAD FINDINGS Brain: 13 x 8 mm focus of reduced diffusion (series 3, image 20) in the right paramedian hemi pons compatible with acute/early subacute infarction. No associated hemorrhage or mass effect. No extra-axial collection, hydrocephalus, mass effect, or herniation. Vascular: Normal flow voids. Skull and upper cervical spine: Normal marrow signal. Sinuses/Orbits: Small mucous retention cyst within the right maxillary sinus. No additional abnormal signal of the paranasal sinuses or mastoid air cells. Orbits are unremarkable. Other: Left inferolateral periorbital dermal cyst. MRA HEAD FINDINGS Internal carotid arteries: Patent. Mild non stenotic irregularity of the carotid siphons compatible with atherosclerotic disease. Anterior cerebral arteries:  Patent. Middle cerebral arteries: Patent. Anterior communicating artery: Patent. Posterior communicating arteries: Not identified, likely hypoplastic or absent. Posterior cerebral arteries:  Patent. Basilar artery:  Patent. Vertebral arteries:  Patent. No evidence of high-grade stenosis, large vessel occlusion, or aneurysm. IMPRESSION: 1. 13 mm acute/early subacute infarction in the right paramedian pons. No associated hemorrhage or mass effect. 2. Patent anterior and posterior intracranial circulation. No large vessel occlusion, aneurysm, or significant stenosis is identified. These results will be called to the ordering clinician or representative by the Radiologist Assistant, and communication documented in the PACS or zVision Dashboard. Electronically Signed   By: Kristine Garbe M.D.   On: 04/20/2018 16:20   Mr Jodene Nam Head Wo Contrast  Result Date: 04/20/2018 CLINICAL DATA:  67 y/o M; woke with unsteady  gait, bilateral leg weakness, and slurred speech. Left facial droop and left-sided weakness. EXAM: MRI HEAD WITHOUT CONTRAST MRA HEAD WITHOUT CONTRAST TECHNIQUE: Multiplanar, multiecho pulse sequences of the brain and surrounding structures were obtained without intravenous contrast. Angiographic images of the head were obtained using MRA technique without contrast. COMPARISON:  04/20/2018 CT head. FINDINGS: MRI HEAD FINDINGS Brain: 13 x 8 mm focus of reduced diffusion (series 3, image 20) in the right paramedian hemi pons compatible with acute/early subacute infarction. No associated hemorrhage or mass effect. No extra-axial collection, hydrocephalus, mass effect, or herniation. Vascular: Normal flow voids. Skull and upper cervical spine: Normal marrow signal. Sinuses/Orbits: Small mucous retention cyst within the right maxillary sinus. No additional abnormal signal of the paranasal sinuses or mastoid air cells. Orbits are unremarkable. Other: Left inferolateral periorbital dermal cyst. MRA HEAD FINDINGS Internal carotid arteries: Patent. Mild non stenotic irregularity of the carotid siphons compatible with atherosclerotic disease. Anterior cerebral arteries:  Patent. Middle cerebral arteries: Patent. Anterior communicating artery: Patent. Posterior communicating arteries: Not identified, likely hypoplastic or absent. Posterior cerebral arteries:  Patent. Basilar artery:  Patent. Vertebral arteries:  Patent. No evidence of high-grade stenosis, large vessel occlusion, or aneurysm. IMPRESSION: 1. 13 mm acute/early subacute infarction in the right paramedian pons. No associated hemorrhage or mass  effect. 2. Patent anterior and posterior intracranial circulation. No large vessel occlusion, aneurysm, or significant stenosis is identified. These results will be called to the ordering clinician or representative by the Radiologist Assistant, and communication documented in the PACS or zVision Dashboard. Electronically  Signed   By: Kristine Garbe M.D.   On: 04/20/2018 16:20     ASSESSMENT AND PLAN   67 y.o. male with past medical history of hypertension, hyperlipidemia, coronary artery disease, tobacco abuse presents to [facility] emergency room after developing sudden onset slurred speech and gait instability.  Presented outside the window for TPA.  MRI brain shows a right paramedian pontine infarction   Acute Ischemic Stroke - Right pontine infarction   Risk factors: Hypertension, hyperlipidemia, tobacco abuse Etiology: small vessel disease   Recommend # MRI of the brain without contrast (completed) #MRA Head and neck (completed) #Transthoracic Echo  # Start patient on ASA 325mg  daily #Start or continue Atorvastatin 80 mg/other high intensity statin # BP goal: permissive HTN upto 220/120 mmHg ( 185/110 if patient has CHF, CKD) # HBAIC and Lipid profile # Telemetry monitoring # Frequent neuro checks # Smoking cessation # NPO until passes stroke swallow screen  Please page stroke NP  Or  PA  Or MD from 8am -4 pm  as this patient from this time will be  followed by the stroke.   You can look them up on [email or web address]  Password [facility]    Corion Sherrod [facility] Pager Number [phone]

## 2018-04-21 NOTE — Evaluation (Signed)
Occupational Therapy Evaluation Patient Details Name: Dennis Harmon MRN: [id number] DOB: 1950/11/09 Today's Date: 04/21/2018    History of Present Illness Dennis Harmon is a 67 y.o. male with medical history of NSTEMI/ CAD, HLD, tobacco abuse who presented to [facility] on 04/20/18 because soon after he woke up, he noticed that his hands and feet were not 'working right'. He states he was having trouble explaining what was wrong & had slurred speech. He denied visual changes and no focal numbness or tingling. He was admitted following MRI of brain Dx: right pontine infarction & was transferred to [facility] for further evaluation.    Clinical Impression   Pt was admitted as above and assessed for acute OT. He currently demonstrates generalized weakness L non-dominant UE and decreased coordination. He is overall supervision-Min guard assist for safety during functional mobility and transfers related to ADL's. He should benefit from out-pt Neuro Rehab to address these impairments. Will follow acutely.    Follow Up Recommendations  Outpatient OT;Supervision - Intermittent    Equipment Recommendations  Other (comment)(Cont to assess in functional context)    Recommendations for Other Services       Precautions / Restrictions Precautions Precautions: Fall Restrictions Weight Bearing Restrictions: No      Mobility Bed Mobility Overal bed mobility: (Pt up in room upon OT arrival)                Transfers Overall transfer level: Needs assistance Equipment used: None Transfers: Sit to/from [employer] Transfers Sit to Stand: Modified independent (Device/Increase time);Supervision Stand pivot transfers: Min guard       General transfer comment: Pt with c/o LLE weakness and beneifts from increased time for tasks as well as Min guard A for transfers/safety although he denies need.    Balance Overall balance assessment: Mild deficits observed, not formally tested;Needs  assistance Sitting-balance support: No upper extremity supported;Feet supported Sitting balance-Leahy Scale: Good     Standing balance support: No upper extremity supported;During functional activity Standing balance-Leahy Scale: Fair Standing balance comment: Pt with noted LUE and LLE generalized weakness during static standing and while grooming at sink. Pt expresses awareness of deficit, but denies need for assist.                           ADL either performed or assessed with clinical judgement   ADL Overall ADL's : Needs assistance/impaired Eating/Feeding: Independent;Sitting   Grooming: Wash/dry hands;Wash/dry face;Oral care;Standing;Sitting;Modified independent Grooming Details (indicate cue type and reason): Pt sitting and standing at sink performing grooming tasks upon OT arrival today. Overall Mod I, increaed time for tasks. Upper Body Bathing: Modified independent;Sitting;Standing   Lower Body Bathing: Modified independent;Sitting/lateral leans Lower Body Bathing Details (indicate cue type and reason): Sitting at sink in room Upper Body Dressing : Modified independent;Sitting   Lower Body Dressing: Modified independent;Supervision/safety;Sitting/lateral leans;Sit to/from stand   Toilet Transfer: Ambulation;Min guard;Grab bars;Regular Toilet;Cueing for safety   Toileting- Water quality scientist and Hygiene: Min guard;Sit to/from stand;Sitting/lateral lean   Tub/ Shower Transfer: Min guard;Ambulation;Cueing for safety(Pt did not perform tub transfer but did perform high stepping activity in room today.)   Functional mobility during ADLs: Supervision/safety;Min guard;Cueing for safety General ADL Comments: Pt was see for OT assessment followed by ADL retrainign session for grooming standing at sink as well as toiltiing and transfers. Pt was educated to use call bell for safety & verbalized understanding of this. Pt was also verbally educated in Role of  OT and  recommendation of out-pt therapy to assist with focus on LUE strengthening and coordination as he demonstrates deficits in these areas. He was agreeable to this, will follow acutely.     Vision Baseline Vision/History: Wears glasses Wears Glasses: Reading only Patient Visual Report: No change from baseline Vision Assessment?: No apparent visual deficits Additional Comments: Pt with 'lump" or stye type appearence under left eyelid.     Perception     Praxis      Pertinent Vitals/Pain Pain Assessment: No/denies pain     Hand Dominance Right   Extremity/Trunk Assessment Upper Extremity Assessment Upper Extremity Assessment: Overall WFL for tasks assessed;LUE deficits/detail LUE Deficits / Details: Gross assessment/MMT LUE = 4/5 vs R UE = 5/5. Pt denies paresthesias, intact to LT LUE. LUE Sensation: (WFL's) LUE Coordination: decreased fine motor;decreased gross motor(A/ROM WFL's except end range overhead shoulder flexion "It feels heavy" per pt report)   Lower Extremity Assessment Lower Extremity Assessment: Defer to PT evaluation       Communication Communication Communication: Other (comment)(Some slurred speech, pt reports "takes me longer" but denies word finding difficulty)   Cognition Arousal/Alertness: Awake/alert Behavior During Therapy: WFL for tasks assessed/performed Overall Cognitive Status: Within Functional Limits for tasks assessed                                     General Comments  Discussed OT recommendation of out-pt Neuro Rehab for LUE strengthening as well as coordination.    Exercises     Shoulder Instructions      Home Living Family/patient expects to be discharged to:: Private residence Living Arrangements: Parent   Type of Home: House Home Access: Stairs to enter Technical brewer of Steps: 5 STE Entrance Stairs-Rails: Can reach both Home Layout: One level     Bathroom Shower/Tub: Insurance underwriter: Standard     Home Equipment: None      Lives With: Other (Comment)(Mother lives with pt)    Prior Functioning/Environment Level of Independence: Independent                 OT Problem List: Decreased strength;Decreased coordination;Impaired UE functional use;Decreased knowledge of precautions      OT Treatment/Interventions: Self-care/ADL training;Therapeutic activities;Therapeutic exercise;Neuromuscular education;Patient/family education    OT Goals(Current goals can be found in the care plan section) Acute Rehab OT Goals Patient Stated Goal: Go home OT Goal Formulation: With patient Time For Goal Achievement: 05/05/18 Potential to Achieve Goals: Good  OT Frequency: Min 2X/week   Barriers to D/C:            Co-evaluation              AM-PAC OT "6 Clicks" Daily Activity     Outcome Measure Help from another person eating meals?: None Help from another person taking care of personal grooming?: A Little Help from another person toileting, which includes using toliet, bedpan, or urinal?: A Little Help from another person bathing (including washing, rinsing, drying)?: A Little Help from another person to put on and taking off regular upper body clothing?: None Help from another person to put on and taking off regular lower body clothing?: A Little(Supervision-Min guard assist for safety) 6 Click Score: 20   End of Session Equipment Utilized During Treatment: Gait belt  Activity Tolerance: Patient tolerated treatment well Patient left: in chair;with call bell/phone within reach  OT Visit Diagnosis:  Muscle weakness (generalized) (M62.81);Hemiplegia and hemiparesis Hemiplegia - Right/Left: Left Hemiplegia - dominant/non-dominant: Non-Dominant Hemiplegia - caused by: Cerebral infarction                Time: 2419-9144 OT Time Calculation (min): 15 min Charges:  OT General Charges $OT Visit: 1 Visit OT Evaluation $OT Eval Moderate Complexity: 1  Mod   Mackinsey Pelland Beth Dixon, OTR/L 04/21/2018, 11:24 AM

## 2018-04-21 NOTE — Evaluation (Signed)
Physical Therapy Evaluation Patient Details Name: Dennis Harmon MRN: [id number] DOB: Jun 12, 1950 Today's Date: 04/21/2018   History of Present Illness  67 yo male with onset of L side weakness and acute R paramedian pons stroke was admitted, had facial weakness as well, slurred speech.   PMHx:  NSTEMI, CAD, HTN, tobacco abuse, non compliance  Clinical Impression  Pt is up to walk with PT and used stairs to cover his needs for  Home.  Pt is motivated to work and was able to get on stairs with min guard, as were all his gait activities.  He has agreed to [facility] and then progress to outpatient as he is cleared for community mobility.  Follow acutely for strength and balance to increase gait safety.    Follow Up Recommendations Home health PT;Supervision for mobility/OOB    Equipment Recommendations  None recommended by PT    Recommendations for Other Services       Precautions / Restrictions Precautions Precautions: Fall Restrictions Weight Bearing Restrictions: No      Mobility  Bed Mobility               General bed mobility comments: up in chair upon PT arrival  Transfers Overall transfer level: Needs assistance Equipment used: None Transfers: Sit to/from Stand Sit to Stand: Supervision(for safety)            Ambulation/Gait Ambulation/Gait assistance: Supervision Gait Distance (Feet): 200 Feet Assistive device: None Gait Pattern/deviations: Step-through pattern;Decreased stride length;Wide base of support Gait velocity: reduced Gait velocity interpretation: <1.31 ft/sec, indicative of household ambulator General Gait Details: no outright LOB but prefers to control balance with RLE  Stairs Stairs: Yes Stairs assistance: Min guard Stair Management: Two rails;Alternating pattern;Forwards Number of Stairs: 8 General stair comments: slow progression with care  Wheelchair Mobility    Modified Rankin (Stroke Patients Only) Modified Rankin (Stroke Patients  Only) Pre-Morbid Rankin Score: No significant disability Modified Rankin: Moderate disability     Balance Overall balance assessment: Needs assistance Sitting-balance support: Feet supported Sitting balance-Leahy Scale: Good     Standing balance support: No upper extremity supported;During functional activity Standing balance-Leahy Scale: Fair                               Pertinent Vitals/Pain Pain Assessment: No/denies pain    Home Living Family/patient expects to be discharged to:: Private residence Living Arrangements: Parent Available Help at Discharge: Family;Available 24 hours/day Type of Home: House Home Access: Stairs to enter Entrance Stairs-Rails: Can reach both Entrance Stairs-Number of Steps: 5 STE Home Layout: One level Home Equipment: None      Prior Function Level of Independence: Independent         Comments: drove and was out in community     Hand Dominance   Dominant Hand: Right    Extremity/Trunk Assessment   Upper Extremity Assessment Upper Extremity Assessment: Overall WFL for tasks assessed;Defer to OT evaluation    Lower Extremity Assessment Lower Extremity Assessment: LLE deficits/detail LLE Deficits / Details: 4- to 4+ strength       Communication   Communication: No difficulties  Cognition Arousal/Alertness: Awake/alert Behavior During Therapy: WFL for tasks assessed/performed Overall Cognitive Status: Within Functional Limits for tasks assessed  General Comments General comments (skin integrity, edema, etc.): plan to ask for HHPT and then transition to outpatient    Exercises     Assessment/Plan    PT Assessment Patient needs continued PT services  PT Problem List Decreased strength;Decreased range of motion;Decreased activity tolerance;Decreased balance;Decreased mobility;Decreased coordination;Decreased knowledge of use of DME;Decreased safety  awareness       PT Treatment Interventions DME instruction;Gait training;Stair training;Functional mobility training;Therapeutic activities;Therapeutic exercise;Balance training;Neuromuscular re-education;Patient/family education    PT Goals (Current goals can be found in the Care Plan section)  Acute Rehab PT Goals Patient Stated Goal: Go home PT Goal Formulation: With patient Time For Goal Achievement: 05/05/18 Potential to Achieve Goals: Good    Frequency Min 4X/week   Barriers to discharge Inaccessible home environment;Decreased caregiver support home with older mother and stairs to enter house    Co-evaluation               AM-PAC PT "6 Clicks" Mobility  Outcome Measure Help needed turning from your back to your side while in a flat bed without using bedrails?: None Help needed moving from lying on your back to sitting on the side of a flat bed without using bedrails?: A Little Help needed moving to and from a bed to a chair (including a wheelchair)?: A Little Help needed standing up from a chair using your arms (e.g., wheelchair or bedside chair)?: A Little Help needed to walk in hospital room?: A Little Help needed climbing 3-5 steps with a railing? : A Little 6 Click Score: 19    End of Session Equipment Utilized During Treatment: Gait belt Activity Tolerance: Patient tolerated treatment well;Other (comment)(mild changes in balance due to strength but no LOB) Patient left: in chair;with call bell/phone within reach Nurse Communication: Mobility status PT Visit Diagnosis: Unsteadiness on feet (R26.81);Muscle weakness (generalized) (M62.81);Hemiplegia and hemiparesis Hemiplegia - Right/Left: Left Hemiplegia - dominant/non-dominant: Non-dominant Hemiplegia - caused by: Cerebral infarction    Time: 1645-1700 PT Time Calculation (min) (ACUTE ONLY): 15 min   Charges:   PT Evaluation $PT Eval Moderate Complexity: 1 Mod         Ramond Dial 04/21/2018, 5:58  PM   Mee Hives, PT MS Acute Rehab Dept. Number: [place]

## 2018-04-22 ENCOUNTER — Other Ambulatory Visit: Payer: Self-pay

## 2018-04-22 MED ORDER — ATORVASTATIN CALCIUM 40 MG PO TABS: 40.0000 mg | ORAL_TABLET | Freq: Every day | ORAL | 0 refills | Status: AC

## 2018-04-22 MED ORDER — ASPIRIN 81 MG PO TBEC: 81.0000 mg | DELAYED_RELEASE_TABLET | Freq: Every day | ORAL | 0 refills | Status: AC

## 2018-04-22 MED ORDER — CLOPIDOGREL BISULFATE 75 MG PO TABS: 75.0000 mg | ORAL_TABLET | Freq: Every day | ORAL | 0 refills | Status: DC

## 2018-04-22 NOTE — Discharge Summary (Signed)
Physician Discharge Summary  Kino Dunsworth ZHG:[id number] DOB: 07/30/1950 DOA: 04/20/2018  PCP: Patient, No Pcp Per  Admit date: 04/20/2018 Discharge date: 04/22/2018  Admitted From: Home Disposition:  Home  Discharge Condition:Stable CODE STATUS:FULL Diet recommendation: Heart Healthy  Brief/Interim Summary: Patient is a 67 year old male with past medical history of coronary disease, and NSTEMI, hyperlipidemia, smoker who presented to the emergency department after he noticed that his hands and feet were not working right.  He had slurred speech also.  He was not taking any medications at home and he was not following with any doctors.  Brain imagings done here showed acute ischemic stroke.  Admitted for further management.  Neurology consulted.  Patient evaluated by PT/OT and recommended home health PT.  He is stable for discharge to home today.    Following problems were addressed during his hospitalization:  Acute CVA: Presented with left facial droop, left arm and leg weakness.  Motor function is improved.  MRI showed acute/early subacute infarction in the right paraMedial pons.  MRA shows patent vessels.  Neurology was following. Carotid Doppler did not show any significant obstruction.  This underwent echocardiogram, which showed ejection fraction of 55 to 60%.  Grade 1 diastolic dysfunction.  No wall motion abnormality.  LDL of 132.  Hemoglobin A1c of 4.9.  Patient seen by occupational therapy /physical therapy and recommended home health. Continue dual antiplatelet regimen for total of 21 days then continue on aspirin 81 mg daily.  Follow-up with neurology as an outpatient.  History of coronary disease: Patient on not any medication at home.  He was following with cardiology in the past but not now.  Started on Lipitor 40 mg.  He was not taking any medications at home.  We have arranged to follow-up with the PCP as an outpatient.  He needs to follow-up with cardiology as an  outpatient.  Tobacco abuse: Smokes a pack a day.  Order nicotine patch ordered.  Counseled for cessation.  Noncompliance issues: Counseled for importance of being compliant to prevent further morbidities.  We have also requested case manager to arrange a follow-up with the PCP ,it has been done already.   Discharge Diagnoses:  Principal Problem:   CVA (cerebral vascular accident) [facility]) Active Problems:   CAD (coronary artery disease)   Hypertension   Hyperlipidemia   Tobacco abuse   Non-compliance    Discharge Instructions  Discharge Instructions    Ambulatory referral to Neurology   Complete by:  As directed    Follow up with stroke clinic NP (Jessica Vanschaick or Cecille Rubin, if both not available, consider Dr. Antony Contras, Dr. Bess Harvest, or Dr. Sarina Ill) at [facility] in about 4 weeks.   Diet - low sodium heart healthy   Complete by:  As directed    Discharge instructions   Complete by:  As directed    1)Please follow with PCP as an outpatient  2) Follow up with neurology as an outpatient 3)Take prescribed medication as instructed. 4) Follow up with a cardiologist as an outpatient in 2 weeks.   Increase activity slowly   Complete by:  As directed      Allergies as of 04/22/2018   No Known Allergies     Medication List    STOP taking these medications   ibuprofen 200 MG tablet Commonly known as:  ADVIL,MOTRIN   isosorbide mononitrate 30 MG 24 hr tablet Commonly known as:  IMDUR   lisinopril 5 MG tablet Commonly known as:  PRINIVIL,ZESTRIL   metoprolol tartrate 25 MG tablet Commonly known as:  LOPRESSOR     TAKE these medications   aspirin 81 MG EC tablet Take 1 tablet (81 mg total) by mouth daily.   atorvastatin 40 MG tablet Commonly known as:  LIPITOR Take 1 tablet (40 mg total) by mouth daily at 6 PM. What changed:    when to take this  additional instructions   clopidogrel 75 MG tablet Commonly known as:   PLAVIX Take 1 tablet (75 mg total) by mouth daily. Take till Dec 24 Start taking on:  04/23/2018      Follow-up Information    Lucianne Lei, MD Follow up on 04/28/2018.   Specialty:  Family Medicine Why:  You will be seing Dr Brien Mates.  Your appointment is at 10 am. please arrive 15 min early. please bring: picture ID, medications and insurance card with co pay. Contact information: [address] [place] [address] [phone]        [facility] Follow up in 4 week(s).   Specialty:  Neurology Why:  stroke clinic. office will call with appt date and time Contact information: [address] [place] [phone]         No Known Allergies  Consultations:  Neurology   Procedures/Studies: Ct Head Wo Contrast  Result Date: 04/20/2018 CLINICAL DATA:  67 year old male awoke this morning with unsteady gait and bilateral leg weakness and slurred speech. Left facial droop. Decrease left hand grip. 51 Initial encounter. EXAM: CT HEAD WITHOUT CONTRAST TECHNIQUE: Contiguous axial images were obtained from the base of the skull through the vertex without intravenous contrast. COMPARISON:  None. FINDINGS: Brain: No intracranial hemorrhage or CT evidence of large acute infarct. MR can be obtained if infarct remains of high clinical concern. No intracranial mass lesion noted on this unenhanced exam. Vascular: Calcified plaque cavernous segment internal carotid artery bilaterally. No acute hyperdense vessel. Skull: No acute abnormality. Sinuses/Orbits: Exophthalmos. Lateral to the inferior aspect of the left orbit is a 1.2 cm rounded low-density structure of indeterminate etiology. Clinical correlation recommended. Visualized paranasal sinuses are clear. Other: Mastoid air cells and middle ear cavities are clear. IMPRESSION: 1. No intracranial hemorrhage or CT evidence of large acute infarct. 2. Calcified plaque cavernous segment  internal carotid artery bilaterally. 3. Exophthalmos. Lateral to the inferior aspect of the left orbit is a 1.2 cm rounded low-density structure of indeterminate etiology. Clinical correlation recommended. Electronically Signed   By: Genia Del M.D.   On: 04/20/2018 13:54   Mr Brain Wo Contrast  Result Date: 04/20/2018 CLINICAL DATA:  67 y/o M; woke with unsteady gait, bilateral leg weakness, and slurred speech. Left facial droop and left-sided weakness. EXAM: MRI HEAD WITHOUT CONTRAST MRA HEAD WITHOUT CONTRAST TECHNIQUE: Multiplanar, multiecho pulse sequences of the brain and surrounding structures were obtained without intravenous contrast. Angiographic images of the head were obtained using MRA technique without contrast. COMPARISON:  04/20/2018 CT head. FINDINGS: MRI HEAD FINDINGS Brain: 13 x 8 mm focus of reduced diffusion (series 3, image 20) in the right paramedian hemi pons compatible with acute/early subacute infarction. No associated hemorrhage or mass effect. No extra-axial collection, hydrocephalus, mass effect, or herniation. Vascular: Normal flow voids. Skull and upper cervical spine: Normal marrow signal. Sinuses/Orbits: Small mucous retention cyst within the right maxillary sinus. No additional abnormal signal of the paranasal sinuses or mastoid air cells. Orbits are unremarkable. Other: Left inferolateral periorbital dermal cyst. MRA HEAD  FINDINGS Internal carotid arteries: Patent. Mild non stenotic irregularity of the carotid siphons compatible with atherosclerotic disease. Anterior cerebral arteries:  Patent. Middle cerebral arteries: Patent. Anterior communicating artery: Patent. Posterior communicating arteries: Not identified, likely hypoplastic or absent. Posterior cerebral arteries:  Patent. Basilar artery:  Patent. Vertebral arteries:  Patent. No evidence of high-grade stenosis, large vessel occlusion, or aneurysm. IMPRESSION: 1. 13 mm acute/early subacute infarction in the right  paramedian pons. No associated hemorrhage or mass effect. 2. Patent anterior and posterior intracranial circulation. No large vessel occlusion, aneurysm, or significant stenosis is identified. These results will be called to the ordering clinician or representative by the Radiologist Assistant, and communication documented in the PACS or zVision Dashboard. Electronically Signed   By: Kristine Garbe M.D.   On: 04/20/2018 16:20   Mr Jodene Nam Head Wo Contrast  Result Date: 04/20/2018 CLINICAL DATA:  67 y/o M; woke with unsteady gait, bilateral leg weakness, and slurred speech. Left facial droop and left-sided weakness. EXAM: MRI HEAD WITHOUT CONTRAST MRA HEAD WITHOUT CONTRAST TECHNIQUE: Multiplanar, multiecho pulse sequences of the brain and surrounding structures were obtained without intravenous contrast. Angiographic images of the head were obtained using MRA technique without contrast. COMPARISON:  04/20/2018 CT head. FINDINGS: MRI HEAD FINDINGS Brain: 13 x 8 mm focus of reduced diffusion (series 3, image 20) in the right paramedian hemi pons compatible with acute/early subacute infarction. No associated hemorrhage or mass effect. No extra-axial collection, hydrocephalus, mass effect, or herniation. Vascular: Normal flow voids. Skull and upper cervical spine: Normal marrow signal. Sinuses/Orbits: Small mucous retention cyst within the right maxillary sinus. No additional abnormal signal of the paranasal sinuses or mastoid air cells. Orbits are unremarkable. Other: Left inferolateral periorbital dermal cyst. MRA HEAD FINDINGS Internal carotid arteries: Patent. Mild non stenotic irregularity of the carotid siphons compatible with atherosclerotic disease. Anterior cerebral arteries:  Patent. Middle cerebral arteries: Patent. Anterior communicating artery: Patent. Posterior communicating arteries: Not identified, likely hypoplastic or absent. Posterior cerebral arteries:  Patent. Basilar artery:  Patent.  Vertebral arteries:  Patent. No evidence of high-grade stenosis, large vessel occlusion, or aneurysm. IMPRESSION: 1. 13 mm acute/early subacute infarction in the right paramedian pons. No associated hemorrhage or mass effect. 2. Patent anterior and posterior intracranial circulation. No large vessel occlusion, aneurysm, or significant stenosis is identified. These results will be called to the ordering clinician or representative by the Radiologist Assistant, and communication documented in the PACS or zVision Dashboard. Electronically Signed   By: Kristine Garbe M.D.   On: 04/20/2018 16:20   Vas US Carotid (at [place] Only)  Result Date: 04/22/2018 Carotid Arterial Duplex Study Indications: CVA. Performing Technologist: Toma Copier RVS  Examination Guidelines: A complete evaluation includes B-mode imaging, spectral Doppler, color Doppler, and power Doppler as needed of all accessible portions of each vessel. Bilateral testing is considered an integral part of a complete examination. Limited examinations for reoccurring indications may be performed as noted.  Right Carotid Findings: +----------+--------+--------+--------+------------+-----------------------+           PSV cm/sEDV cm/sStenosisDescribe    Comments                +----------+--------+--------+--------+------------+-----------------------+ CCA Prox  98      19                          mild intimal thickening +----------+--------+--------+--------+------------+-----------------------+ CCA Distal83      19  mild intimal thickening +----------+--------+--------+--------+------------+-----------------------+ ICA Prox  105     22              heterogenousmild plaque             +----------+--------+--------+--------+------------+-----------------------+ ICA Mid   88      25                                                   +----------+--------+--------+--------+------------+-----------------------+ ICA Distal96      32                          tortuous                +----------+--------+--------+--------+------------+-----------------------+ ECA       95      14              heterogenousmild plaque             +----------+--------+--------+--------+------------+-----------------------+ +----------+--------+-------+--------+-------------------+           PSV cm/sEDV cmsDescribeArm Pressure (mmHG) +----------+--------+-------+--------+-------------------+ Subclavian132                                        +----------+--------+-------+--------+-------------------+ +---------+--------+--+--------+--+ VertebralPSV cm/s66EDV cm/s17 +---------+--------+--+--------+--+  Left Carotid Findings: +----------+--------+--------+--------+------------+-----------------------+           PSV cm/sEDV cm/sStenosisDescribe    Comments                +----------+--------+--------+--------+------------+-----------------------+ CCA Prox  101     15                          minimal wall thickening +----------+--------+--------+--------+------------+-----------------------+ CCA Distal96      17                          mild intimal thickening +----------+--------+--------+--------+------------+-----------------------+ ICA Prox  78      24              heterogenousmild plaque             +----------+--------+--------+--------+------------+-----------------------+ ICA Mid   58      19                                                  +----------+--------+--------+--------+------------+-----------------------+ ICA Distal83      26                          tortuous                +----------+--------+--------+--------+------------+-----------------------+ ECA       82      16              heterogenousminimal plaque           +----------+--------+--------+--------+------------+-----------------------+ +----------+--------+--------+--------+-------------------+ SubclavianPSV cm/sEDV cm/sDescribeArm Pressure (mmHG) +----------+--------+--------+--------+-------------------+           84                                          +----------+--------+--------+--------+-------------------+ +---------+--------+--+--------+--+  VertebralPSV cm/s67EDV cm/s19 +---------+--------+--+--------+--+  Summary: Right Carotid: Velocities in the right ICA are consistent with a 1-39% stenosis. Left Carotid: Velocities in the left ICA are consistent with a 1-39% stenosis. Vertebrals:  Bilateral vertebral arteries demonstrate antegrade flow. Subclavians: Normal flow hemodynamics were seen in bilateral subclavian              arteries. *See table(s) above for measurements and observations.  Electronically signed by Antony Contras MD on 04/22/2018 at 8:43:05 AM.    Final       Subjective: Patient seen and examined the bedside this morning.  Remains comfortable.  No active issues.  Stable for discharge home today.  Discharge Exam: Vitals:   04/22/18 0408 04/22/18 0803  BP: 134/68 140/78  Pulse: 75 80  Resp: 17 16  Temp: (!) 97.5 F (36.4 C) 97.7 F (36.5 C)  SpO2: 99% 99%   Vitals:   04/21/18 1950 04/21/18 2353 04/22/18 0408 04/22/18 0803  BP: (!) 147/76 126/77 134/68 140/78  Pulse: 87 72 75 80  Resp: 18 18 17 16   Temp: (!) 97.4 F (36.3 C) (!) 97.4 F (36.3 C) (!) 97.5 F (36.4 C) 97.7 F (36.5 C)  TempSrc: Oral Oral Oral Oral  SpO2: 95% 98% 99% 99%  Weight:      Height:        General: Pt is alert, awake, not in acute distress Cardiovascular: RRR, S1/S2 +, no rubs, no gallops Respiratory: CTA bilaterally, no wheezing, no rhonchi Abdominal: Soft, NT, ND, bowel sounds + Extremities: no edema, no cyanosis    The results of significant diagnostics from this hospitalization (including imaging, microbiology,  ancillary and laboratory) are listed below for reference.     Microbiology: No results found for this or any previous visit (from the past 240 hour(s)).   Labs: BNP (last 3 results) No results for input(s): BNP in the last 8760 hours. Basic Metabolic Panel: Recent Labs  Lab 04/20/18 1354 04/20/18 1400  NA 139 139  K 4.1 4.1  CL 106 106  CO2 24  --   GLUCOSE 91 87  BUN 15 14  CREATININE 0.92  0.87 0.80  CALCIUM 9.6  --    Liver Function Tests: Recent Labs  Lab 04/20/18 1354  AST 27  ALT 22  ALKPHOS 82  BILITOT 1.1  PROT 8.9*  ALBUMIN 4.8   No results for input(s): LIPASE, AMYLASE in the last 168 hours. No results for input(s): AMMONIA in the last 168 hours. CBC: Recent Labs  Lab 04/20/18 1354 04/20/18 1400  WBC 7.2  7.4  --   NEUTROABS 3.3  --   HGB 13.4  13.4 15.0  HCT 40.9  41.6 44.0  MCV 98.3  97.9  --   PLT 349  340  --    Cardiac Enzymes: No results for input(s): CKTOTAL, CKMB, CKMBINDEX, TROPONINI in the last 168 hours. BNP: Invalid input(s): POCBNP CBG: Recent Labs  Lab 04/20/18 1426  GLUCAP 78   D-Dimer No results for input(s): DDIMER in the last 72 hours. Hgb A1c Recent Labs    04/21/18 1527  HGBA1C 4.9   Lipid Profile Recent Labs    04/21/18 1527  CHOL 213*  HDL 32*  LDLCALC 132*  TRIG 246*  CHOLHDL 6.7   Thyroid function studies No results for input(s): TSH, T4TOTAL, T3FREE, THYROIDAB in the last 72 hours.  Invalid input(s): FREET3 Anemia work up No results for input(s): VITAMINB12, FOLATE, FERRITIN, TIBC, IRON, RETICCTPCT in the last 72 hours.  Urinalysis No results found for: COLORURINE, APPEARANCEUR, LABSPEC, [place], GLUCOSEU, HGBUR, BILIRUBINUR, KETONESUR, PROTEINUR, UROBILINOGEN, NITRITE, LEUKOCYTESUR Sepsis Labs Invalid input(s): PROCALCITONIN,  WBC,  LACTICIDVEN Microbiology No results found for this or any previous visit (from the past 240 hour(s)).  Please note: You were cared for by a hospitalist  during your hospital stay. Once you are discharged, your primary care physician will handle any further medical issues. Please note that NO REFILLS for any discharge medications will be authorized once you are discharged, as it is imperative that you return to your primary care physician (or establish a relationship with a primary care physician if you do not have one) for your post hospital discharge needs so that they can reassess your need for medications and monitor your lab values.    Time coordinating discharge: 40 minutes  SIGNED:   Shelly Coss, MD  [facility] 04/22/2018, 10:29 AM Pager [phone]  If 7PM-7AM, please contact night-coverage [email or web address] Password [email or web address]

## 2018-04-22 NOTE — Care Management Note (Signed)
Case Management Note  Patient Details  Name: Dennis Harmon MRN: [id number] Date of Birth: 07/21/1950  Subjective/Objective:                    Action/Plan: Pt discharging home with [facility] services. CM provided choice and he selected [place]. Butch Penny with [facility] notified and accepted the referral.  Pt has transportation home.   Expected Discharge Date:  04/22/18               Expected Discharge Plan:  [place]  In-House Referral:     Discharge planning Services  CM Consult  Post Acute Care Choice:    Choice offered to:     DME Arranged:    DME Agency:     HH Arranged:    [place] Agency:     Status of Service:  Completed, signed off  If discussed at [employer], dates discussed:    Additional Comments:  Pollie Friar, RN 04/22/2018, 12:32 PM

## 2018-04-22 NOTE — Consult Note (Signed)
   [facility] CM Inpatient Consult   04/22/2018  Denman Pichardo 1951/02/03 [id number]  Patient screened for potential [place] Management services.  Chart review reveals the patient has no current primary care provider.  Chart reviewed and spoke with inpatient RNCM and she has set patient up with a primary care provider in the [facility]  Met with the patient at the bedside regarding [place] Management and benefits especially during his time for post hospital transition back home. Expressed the importance of having a primary care provider with post hospital follow up.  Patient will be on new medications. Will assign patient for [facility] Transition of Care follow up.  Patient agreeable and consent form signed.  Patient states he was driving prior to admission and asked him to check with his discharge planner regarding recommendations for this.  Patient states he thinks he will have some home therapy at home.   For questions contact:   Natividad Brood, RN BSN [facility] Liaison  [phone] business mobile phone Toll free office [phone]

## 2018-04-22 NOTE — Progress Notes (Signed)
Occupational Therapy Treatment Patient Details Name: Dennis Harmon MRN: [id number] DOB: [birth year] Today's Date: 04/22/2018    History of present illness 67 yo male with onset of L side weakness and acute R paramedian pons stroke was admitted, had facial weakness as well, slurred speech.   PMHx:  NSTEMI, CAD, HTN, tobacco abuse, non compliance   OT comments  Pt presents standing at sink in room pleasant and willing to participate in therapy session. Pt performing LB ADL with supervision during session; performing functional mobility without AD in room and hallway with minguard-supervision and practicing tub transfer with minguard assist for safety. Issued pt fine motor/coordination handout for use with LUE. Pt demonstrates improvements in use of LUE this session tying shoelaces given increased time/effort. Pt verbalizing and return demonstrating understanding of HEP provided. Pt hopeful for d/c today.    Follow Up Recommendations  Home health OT;Supervision - Intermittent    Equipment Recommendations  None recommended by OT          Precautions / Restrictions Precautions Precautions: Fall Restrictions Weight Bearing Restrictions: No       Mobility Bed Mobility               General bed mobility comments: pt standing in room upon arrival  Transfers Overall transfer level: Needs assistance Equipment used: None Transfers: Sit to/from Stand Sit to Stand: Supervision(for safety)              Balance Overall balance assessment: Needs assistance Sitting-balance support: Feet supported Sitting balance-Leahy Scale: Good     Standing balance support: No upper extremity supported;During functional activity Standing balance-Leahy Scale: Fair                             ADL either performed or assessed with clinical judgement   ADL Overall ADL's : Needs assistance/impaired     Grooming: Supervision/safety;Standing Grooming Details (indicate cue type and  reason): pt completing washing face while standing at sink upon entering room             Lower Body Dressing: Supervision/safety;Sit to/from stand Lower Body Dressing Details (indicate cue type and reason): pt donning shoes without assist seated in recliner (including tying shoelaces)          Tub/ Shower Transfer: Min guard;Ambulation Tub/Shower Transfer Details (indicate cue type and reason): minguard for safety; pt using UE support on wall Functional mobility during ADLs: Supervision/safety;Min guard       Vision       Perception     Praxis      Cognition Arousal/Alertness: Awake/alert Behavior During Therapy: WFL for tasks assessed/performed Overall Cognitive Status: Within Functional Limits for tasks assessed                                          Exercises Exercises: Other exercises Other Exercises Other Exercises: issued fine motor/coordination handout/HEP and reviewed with pt, pt return demonstrating understanding of exercises/activities   Shoulder Instructions       General Comments      Pertinent Vitals/ Pain       Pain Assessment: No/denies pain  Home Living  Prior Functioning/Environment              Frequency  Min 2X/week        Progress Toward Goals  OT Goals(current goals can now be found in the care plan section)  Progress towards OT goals: Progressing toward goals  Acute Rehab OT Goals Patient Stated Goal: Go home OT Goal Formulation: With patient Time For Goal Achievement: 05/05/18 Potential to Achieve Goals: Good  Plan Discharge plan needs to be updated    Co-evaluation                 AM-PAC OT "6 Clicks" Daily Activity     Outcome Measure   Help from another person eating meals?: None Help from another person taking care of personal grooming?: A Little Help from another person toileting, which includes using toliet, bedpan, or  urinal?: A Little Help from another person bathing (including washing, rinsing, drying)?: A Little Help from another person to put on and taking off regular upper body clothing?: None Help from another person to put on and taking off regular lower body clothing?: A Little 6 Click Score: 20    End of Session Equipment Utilized During Treatment: Gait belt  OT Visit Diagnosis: Muscle weakness (generalized) (M62.81);Hemiplegia and hemiparesis Hemiplegia - Right/Left: Left Hemiplegia - dominant/non-dominant: Non-Dominant Hemiplegia - caused by: Cerebral infarction   Activity Tolerance Patient tolerated treatment well   Patient Left in chair;with call bell/phone within reach   Nurse Communication Mobility status        Time: 4332-9518 OT Time Calculation (min): 13 min  Charges: OT General Charges $OT Visit: 1 Visit OT Treatments $Self Care/Home Management : 8-22 mins  Lou Cal, OT Supplemental Rehabilitation Services Pager [phone] Office [phone]    [name] 04/22/2018, 2:12 PM

## 2018-04-23 ENCOUNTER — Other Ambulatory Visit: Payer: Self-pay | Admitting: *Deleted

## 2018-04-23 ENCOUNTER — Encounter: Payer: Self-pay | Admitting: *Deleted

## 2018-04-23 DIAGNOSIS — I69392 Facial weakness following cerebral infarction: Secondary | ICD-10-CM | POA: Diagnosis not present

## 2018-04-23 DIAGNOSIS — I2510 Atherosclerotic heart disease of native coronary artery without angina pectoris: Secondary | ICD-10-CM | POA: Diagnosis not present

## 2018-04-23 DIAGNOSIS — Z9181 History of falling: Secondary | ICD-10-CM | POA: Diagnosis not present

## 2018-04-23 DIAGNOSIS — E785 Hyperlipidemia, unspecified: Secondary | ICD-10-CM | POA: Diagnosis not present

## 2018-04-23 DIAGNOSIS — Z7982 Long term (current) use of aspirin: Secondary | ICD-10-CM | POA: Diagnosis not present

## 2018-04-23 DIAGNOSIS — I252 Old myocardial infarction: Secondary | ICD-10-CM | POA: Diagnosis not present

## 2018-04-23 DIAGNOSIS — I119 Hypertensive heart disease without heart failure: Secondary | ICD-10-CM | POA: Diagnosis not present

## 2018-04-23 DIAGNOSIS — I69354 Hemiplegia and hemiparesis following cerebral infarction affecting left non-dominant side: Secondary | ICD-10-CM | POA: Diagnosis not present

## 2018-04-23 DIAGNOSIS — Z7902 Long term (current) use of antithrombotics/antiplatelets: Secondary | ICD-10-CM | POA: Diagnosis not present

## 2018-04-23 DIAGNOSIS — F17210 Nicotine dependence, cigarettes, uncomplicated: Secondary | ICD-10-CM | POA: Diagnosis not present

## 2018-04-23 NOTE — Patient Outreach (Signed)
[place] [facility]) Care Management [place] Coordination Patient refused participation in [place] services  04/23/2018  Dennis Harmon 06-23-1950 [id number]  Successful telephone outreach to Dennis Harmon, 67 y/o male referred to [place] by [facility] Liaison after recent hospital visit December 2-4, 2019 for ischemic CVA.  Patient was discharged home to self-care with home health services in place through [place].  Patient has history including, but not limited to, CAD with previous NSTEMI; HTN/ HLD; medical non-compliance; tobacco abuse; lack of PCP.  HIPAA/ identity verified with patient today.  Discussed [facility] CM program with patient and explained that I had received referral yesterday from [facility], at which time his written consent for [employer] services was obtained; patient states today that he "does not remember" signing any papers, and he flatly refuses participation in [place] services, despite my attempts to engage him.  Patient stated that he has home health services and he "does need any more than that."  He confirmed that he is aware of and plans to attend upcoming scheduled appointment with new PCP, Dr. Criss Rosales on April 28, 2018, and states he will drive himself to this appointment.  I encouraged patient to promptly schedule post-hospital neurology appointment, as instructed post-hospital discharge and he stated he "would do."  Patient denies concerns around his clinical condition, stating, "I am doing fine."  He stated that he has and is taking all of his medications, and denies concerns with medications.  With much urging, patient did finally accept my direct phone number should he change his mind and wish to participate in [place] program, but today he clearly declined [place] services multiple times during phone call today.  Plan:  Will make patient [place]  CM status as inactive, as he clearly declines participation today.  Oneta Rack, RN, BSN, [employer] [facility] Care Management  [phone]

## 2018-04-28 DIAGNOSIS — F17200 Nicotine dependence, unspecified, uncomplicated: Secondary | ICD-10-CM | POA: Diagnosis not present

## 2018-04-28 DIAGNOSIS — Z125 Encounter for screening for malignant neoplasm of prostate: Secondary | ICD-10-CM | POA: Diagnosis not present

## 2018-04-28 DIAGNOSIS — H0266 Xanthelasma of left eye, unspecified eyelid: Secondary | ICD-10-CM | POA: Diagnosis not present

## 2018-04-28 DIAGNOSIS — I10 Essential (primary) hypertension: Secondary | ICD-10-CM | POA: Diagnosis not present

## 2018-04-28 DIAGNOSIS — F329 Major depressive disorder, single episode, unspecified: Secondary | ICD-10-CM | POA: Diagnosis not present

## 2018-04-28 DIAGNOSIS — E785 Hyperlipidemia, unspecified: Secondary | ICD-10-CM | POA: Diagnosis not present

## 2018-04-28 DIAGNOSIS — Z8673 Personal history of transient ischemic attack (TIA), and cerebral infarction without residual deficits: Secondary | ICD-10-CM | POA: Diagnosis not present

## 2018-04-28 DIAGNOSIS — Z716 Tobacco abuse counseling: Secondary | ICD-10-CM | POA: Diagnosis not present

## 2018-05-06 ENCOUNTER — Other Ambulatory Visit: Payer: Self-pay

## 2018-05-06 NOTE — Patient Outreach (Signed)
[place] [facility]) Care Management  05/06/2018  Dennis Harmon [birth year] [id number]   EMMI- stroke RED ON EMMI ALERT Day # 13 Date: 05/05/18 Red Alert Reason:  Questions/problems with meds? Yes  Outreach attempt: spoke with patient.  He is able to verify HIPAA.  Addressed red alert. Patient states that he does not have any problems of questions with his medications.  Asked patient did he see the doctor last week.  He states that he did not see Dr. Criss Rosales but someone in the clinic named Curt Bears.  He denies any questions, needs, or concerns at this time.     Plan: RN CM will close case.  Jone Baseman, RN, MSN [facility] Care Management Care Management Coordinator Direct Line [phone] Toll Free: [phone]  Fax: [phone]

## 2018-05-25 NOTE — Progress Notes (Signed)
[facility] [address] [place]. [place] [address] [phone]       OFFICE FOLLOW UP NOTE  [name] Date of Birth:  May 20, 1951 Medical Record Number:  [id number]   Reason for Referral:  hospital stroke follow up  CHIEF COMPLAINT:  Chief Complaint  Patient presents with  . Follow-up    Hospital Stroke follow up room 9 pt alone    HPI: Dennis Harmon is being seen today for initial visit in the office for right paramedian pontine infarct secondary to small vessel disease on 04/20/2018. History obtained from patient and chart review. Reviewed all radiology images and labs personally.  Dennis Harmon is a 68 y.o. male with history of HTN, HLD, CAD and tobacco abuse  who presented with sudden onset slurred speech and gait instability.  CT had reviewed and was negative for acute infarct.  MRI brain reviewed and showed right paramedian pontine infarct.  MRA unremarkable.  Carotid Doppler and 2D echo unremarkable.  LDL 132 and A1c 4.9.  Infarct likely due to multiple stroke risk factors including CAD, HTN, HLD and tobacco use.  Recommended DAPT for 3 weeks then aspirin alone.  Previously prescribed atorvastatin 40 mg but noncompliant therefore recommended continuation of atorvastatin 40 mg daily at discharge for HLD management.  Therapies recommended outpatient PT/OT and was discharged in stable condition.  Patient is being seen today for hospital follow-up.  He states he continues to have balance difficulties but this has been improving.  He also continues to have intermittently slowed speech but this also has been improving.  He was evaluated by home therapy and was dismissed as he no longer needed this.  He does have occasional right-sided occipital headaches which have been present since his stroke.  He denies seizures worsening and possibly even improving and denies these headaches is debilitating.  He has returned back to all prior activities but does state "sometimes I am  slower in walking or completing an activity".  He has continued on both aspirin and Plavix without side effects of bleeding or bruising.  Continues on atorvastatin without side effects myalgias.  Blood pressure today 153/85.  He does not routinely monitor BP at home.  No further concerns at this time.  Denies new or worsening stroke/TIA symptoms.    ROS:   14 system review of systems performed and negative with exception of headache, speech difficulty and walking difficulty  PMH:  Past Medical History:  Diagnosis Date  . Coronary artery disease    NSTEMI 9/12:  cath 01/31/11: EF 60%, Inf HK, mRCA 30-40%, LAD ok, mCFX 50%, OM2 occluded (likely > 24hr with [place] 1 flow).  Medical Tx was recommended and PCI would be deferred for recurrent ischemia.  Echo 01/31/11: mild LVH, EF 55%, AL HK, grade 1 diast dysfxn, PASP 27-31  . HTN (hypertension)   . Hyperlipidemia   . Stroke ([place])   . Tobacco abuse     PSH:  Past Surgical History:  Procedure Laterality Date  . CARDIAC CATHETERIZATION  01/31/11  . THUMB SURGERY     RIGHT THUMB LIGAMENT SURGERY    Social History:  Social History   Socioeconomic History  . Marital status: Single    Spouse name: Not on file  . Number of children: Not on file  . Years of education: Not on file  . Highest education level: Not on file  Occupational History  . Not on file  Social Needs  . Financial resource strain: Not on  file  . Food insecurity:    Worry: Not on file    Inability: Not on file  . Transportation needs:    Medical: Not on file    Non-medical: Not on file  Tobacco Use  . Smoking status: Current Every Day Smoker    Packs/day: 0.50    Years: 30.00    Pack years: 15.00    Types: Cigarettes  . Smokeless tobacco: Never Used  Substance and Sexual Activity  . Alcohol use: Yes    Comment: 2 BEERS DAILY  . Drug use: No  . Sexual activity: Not on file  Lifestyle  . Physical activity:    Days per week: Not on file    Minutes per session:  Not on file  . Stress: Not on file  Relationships  . Social connections:    Talks on phone: Not on file    Gets together: Not on file    Attends religious service: Not on file    Active member of club or organization: Not on file    Attends meetings of clubs or organizations: Not on file    Relationship status: Not on file  . Intimate partner violence:    Fear of current or ex partner: Not on file    Emotionally abused: Not on file    Physically abused: Not on file    Forced sexual activity: Not on file  Other Topics Concern  . Not on file  Social History Narrative  . Not on file    Family History:  Family History  Problem Relation Age of Onset  . Stroke Paternal Uncle     Medications:   Current Outpatient Medications on File Prior to Visit  Medication Sig Dispense Refill  . amLODipine (NORVASC) 5 MG tablet Take 5 mg by mouth at bedtime.    Marland Kitchen aspirin EC 81 MG EC tablet Take 1 tablet (81 mg total) by mouth daily. 30 tablet 0  . atorvastatin (LIPITOR) 40 MG tablet Take 1 tablet (40 mg total) by mouth daily at 6 PM. 30 tablet 0  . buPROPion (WELLBUTRIN XL) 150 MG 24 hr tablet Take 150 mg by mouth daily.    . clopidogrel (PLAVIX) 75 MG tablet Take 1 tablet (75 mg total) by mouth daily. Take till Dec 24 20 tablet 0   No current facility-administered medications on file prior to visit.     Allergies:  No Known Allergies   Physical Exam  Vitals:   05/26/18 0815  BP: (!) 153/85  Pulse: 90  Weight: 187 lb (84.8 kg)  Height: 5\' 6"  (1.676 m)   Body mass index is 30.18 kg/m. No exam data present  General: well developed, well nourished, pleasant elderly African-American male, seated, in no evident distress Head: head normocephalic and atraumatic.   Neck: supple with no carotid or supraclavicular bruits Cardiovascular: regular rate and rhythm, no murmurs Musculoskeletal: no deformity Skin:  no rash/petichiae Vascular:  Normal pulses all extremities  Neurologic  Exam Mental Status: Awake and fully alert.  Unable to appreciate speech difficulty during assessment.  Oriented to place and time. Recent and remote memory intact. Attention span, concentration and fund of knowledge appropriate. Mood and affect appropriate.  Cranial Nerves: Fundoscopic exam reveals sharp disc margins. Pupils equal, briskly reactive to light. Extraocular movements full without nystagmus. Visual fields full to confrontation. Hearing intact. Facial sensation intact. Face, tongue, palate moves normally and symmetrically.  Motor: Normal bulk and tone. Normal strength in all tested extremity muscles except  for left hand decreased dexterity Sensory.:  Decreased sensation left upper and lower extremity compared to right upper and lower extremity Coordination: Rapid alternating movements normal in all extremities except for decreased left hand finger dexterity.  Orbits right arm over left arm.. Finger-to-nose and heel-to-shin performed accurately bilaterally. Gait and Station: Arises from chair without difficulty. Stance is normal. Gait demonstrates normal stride length and balance with slight favoring of left leg. Able to heel, toe and tandem walk with mild difficulty.  Reflexes: 1+ and symmetric. Toes downgoing.    NIHSS  1 Modified Rankin  1    Diagnostic Data (Labs, Imaging, Testing)  CT HEAD WO CONTRAST 04/20/2018 IMPRESSION: 1. No intracranial hemorrhage or CT evidence of large acute infarct. 2. Calcified plaque cavernous segment internal carotid artery bilaterally. 3. Exophthalmos. Lateral to the inferior aspect of the left orbit is a 1.2 cm rounded low-density structure of indeterminate etiology. Clinical correlation recommended.  MR BRAIN WO CONTRAST MR MRA HEAD  04/20/2018 IMPRESSION: 1. 13 mm acute/early subacute infarction in the right paramedian pons. No associated hemorrhage or mass effect. 2. Patent anterior and posterior intracranial circulation. No  large vessel occlusion, aneurysm, or significant stenosis is identified.  ECHOCARDIOGRAM 05/18/2018 Study Conclusions - Left ventricle: The cavity size was normal. Wall thickness was   increased in a pattern of mild LVH. Systolic function was normal.   The estimated ejection fraction was in the range of 55% to 60%.   Wall motion was normal; there were no regional wall motion   abnormalities. Doppler parameters are consistent with abnormal   left ventricular relaxation (grade 1 diastolic dysfunction).  VAS US CAROTID DUPLEX BILATERAL 04/21/2018 Summary: Right Carotid: Velocities in the right ICA are consistent with a 1-39% stenosis.  Left Carotid: Velocities in the left ICA are consistent with a 1-39% stenosis.  Vertebrals:  Bilateral vertebral arteries demonstrate antegrade flow. Subclavians: Normal flow hemodynamics were seen in bilateral subclavian              arteries.    ASSESSMENT: Dennis Harmon is a 68 y.o. year old male here with right paramedian pontine infarct on 04/20/2018 secondary to small vessel disease. Vascular risk factors include HTN, HLD, CAD and tobacco use.  Patient is being seen today for hospital follow-up and does continue to have occasional speech difficulty, balance difficulty and decreased left hand dexterity.    PLAN:  1. Right pontine infarct: Continue aspirin 81 mg daily  and atorvastatin 40 mg daily for secondary stroke prevention.  Advised patient to discontinue Plavix at this time as he has completed 3 weeks DAPT.  Maintain strict control of hypertension with blood pressure goal below 130/90, diabetes with hemoglobin A1c goal below 6.5% and cholesterol with LDL cholesterol (bad cholesterol) goal below 70 mg/dL.  I also advised the patient to eat a healthy diet with plenty of whole grains, cereals, fruits and vegetables, exercise regularly with at least 30 minutes of continuous activity daily and maintain ideal body weight. 2. HTN: Advised to continue  current treatment regimen.  Today's BP 153/85.  Advised patient to start to monitor BP at home to ensure satisfactory management along with continued follow-up with PCP 3. HLD: Advised to continue current treatment regimen along with continued follow-up with PCP for future prescribing and monitoring of lipid panel 4. Post stroke deficits: Patient declines wanting to do any additional therapy at this time and prefers to continue to do exercises at home on his own.  He was advised that if he  would like to start therapy sessions, to notify office and orders will be placed    Follow up in 3 months or call earlier if needed   Greater than 50% of time during this 25 minute visit was spent on counseling, explanation of diagnosis of right pontine infarct, reviewing risk factor management of HLD, HTN, CAD and tobacco use, planning of further management along with potential future management, and discussion with patient and family answering all questions.    Venancio Poisson, AGNP-BC  [facility] [address] [place], [place] [address]  Phone [phone] Fax [phone] Note: This document was prepared with digital dictation and possible smart phrase technology. Any transcriptional errors that result from this process are unintentional.

## 2018-05-26 ENCOUNTER — Encounter: Payer: Self-pay | Admitting: Adult Health

## 2018-05-26 ENCOUNTER — Ambulatory Visit (INDEPENDENT_AMBULATORY_CARE_PROVIDER_SITE_OTHER): Payer: PPO | Admitting: Adult Health

## 2018-05-26 VITALS — BP 153/85 | HR 90 | Ht 66.0 in | Wt 187.0 lb

## 2018-05-26 DIAGNOSIS — E785 Hyperlipidemia, unspecified: Secondary | ICD-10-CM

## 2018-05-26 DIAGNOSIS — I63511 Cerebral infarction due to unspecified occlusion or stenosis of right middle cerebral artery: Secondary | ICD-10-CM

## 2018-05-26 DIAGNOSIS — I10 Essential (primary) hypertension: Secondary | ICD-10-CM | POA: Diagnosis not present

## 2018-05-26 NOTE — Patient Instructions (Signed)
Continue aspirin 81 mg daily  and Lipitor for secondary stroke prevention  Discontinue plavix at this time and continue on aspirin alone  Continue to follow up with PCP regarding cholesterol and blood pressure management   Continue to stay active and maintain a healthy diet - if you would like to start outpatient therapies, please let us know  Continue to monitor blood pressure at home  Maintain strict control of hypertension with blood pressure goal below 130/90, diabetes with hemoglobin A1c goal below 6.5% and cholesterol with LDL cholesterol (bad cholesterol) goal below 70 mg/dL. I also advised the patient to eat a healthy diet with plenty of whole grains, cereals, fruits and vegetables, exercise regularly and maintain ideal body weight.  Followup in the future with me in 3 months or call earlier if needed       Thank you for coming to see [place] at [facility]. I hope we have been able to provide you high quality care today.  You may receive a patient satisfaction survey over the next few weeks. We would appreciate your feedback and comments so that we may continue to improve ourselves and the health of our patients.

## 2018-06-05 DIAGNOSIS — Z1212 Encounter for screening for malignant neoplasm of rectum: Secondary | ICD-10-CM | POA: Diagnosis not present

## 2018-06-05 DIAGNOSIS — Z1211 Encounter for screening for malignant neoplasm of colon: Secondary | ICD-10-CM | POA: Diagnosis not present

## 2018-06-05 NOTE — Progress Notes (Signed)
I agree with the above plan 

## 2018-07-08 DIAGNOSIS — R195 Other fecal abnormalities: Secondary | ICD-10-CM | POA: Diagnosis not present

## 2018-07-08 DIAGNOSIS — I6930 Unspecified sequelae of cerebral infarction: Secondary | ICD-10-CM | POA: Diagnosis not present

## 2018-07-08 DIAGNOSIS — I2510 Atherosclerotic heart disease of native coronary artery without angina pectoris: Secondary | ICD-10-CM | POA: Diagnosis not present

## 2018-07-13 DIAGNOSIS — H0266 Xanthelasma of left eye, unspecified eyelid: Secondary | ICD-10-CM | POA: Diagnosis not present

## 2018-07-13 DIAGNOSIS — Z0000 Encounter for general adult medical examination without abnormal findings: Secondary | ICD-10-CM | POA: Diagnosis not present

## 2018-07-13 DIAGNOSIS — F17200 Nicotine dependence, unspecified, uncomplicated: Secondary | ICD-10-CM | POA: Diagnosis not present

## 2018-07-13 DIAGNOSIS — F330 Major depressive disorder, recurrent, mild: Secondary | ICD-10-CM | POA: Diagnosis not present

## 2018-07-13 DIAGNOSIS — Z716 Tobacco abuse counseling: Secondary | ICD-10-CM | POA: Diagnosis not present

## 2018-07-13 DIAGNOSIS — I10 Essential (primary) hypertension: Secondary | ICD-10-CM | POA: Diagnosis not present

## 2018-08-19 ENCOUNTER — Telehealth: Payer: Self-pay

## 2018-08-19 NOTE — Telephone Encounter (Signed)
Spoke with the patient about his appointment with Janett Billow, NP 08/26/2018. I offered a webex appointment but patient preferred to cancel his appointment. He stated that he would call back to reschedule at a later date.

## 2018-08-26 ENCOUNTER — Ambulatory Visit: Payer: PPO | Admitting: Adult Health

## 2018-10-07 DIAGNOSIS — I2510 Atherosclerotic heart disease of native coronary artery without angina pectoris: Secondary | ICD-10-CM | POA: Diagnosis not present

## 2018-10-07 DIAGNOSIS — Z1211 Encounter for screening for malignant neoplasm of colon: Secondary | ICD-10-CM | POA: Diagnosis not present

## 2018-10-07 DIAGNOSIS — I6930 Unspecified sequelae of cerebral infarction: Secondary | ICD-10-CM | POA: Diagnosis not present

## 2018-10-07 DIAGNOSIS — R195 Other fecal abnormalities: Secondary | ICD-10-CM | POA: Diagnosis not present

## 2018-10-20 DIAGNOSIS — E785 Hyperlipidemia, unspecified: Secondary | ICD-10-CM | POA: Diagnosis not present

## 2018-10-20 DIAGNOSIS — H0266 Xanthelasma of left eye, unspecified eyelid: Secondary | ICD-10-CM | POA: Diagnosis not present

## 2018-10-20 DIAGNOSIS — Z8673 Personal history of transient ischemic attack (TIA), and cerebral infarction without residual deficits: Secondary | ICD-10-CM | POA: Diagnosis not present

## 2018-10-20 DIAGNOSIS — I10 Essential (primary) hypertension: Secondary | ICD-10-CM | POA: Diagnosis not present

## 2018-11-09 DIAGNOSIS — R195 Other fecal abnormalities: Secondary | ICD-10-CM | POA: Diagnosis not present

## 2018-11-09 DIAGNOSIS — Z01818 Encounter for other preprocedural examination: Secondary | ICD-10-CM | POA: Diagnosis not present

## 2018-11-09 DIAGNOSIS — K635 Polyp of colon: Secondary | ICD-10-CM | POA: Diagnosis not present

## 2018-11-19 DIAGNOSIS — K635 Polyp of colon: Secondary | ICD-10-CM | POA: Diagnosis not present

## 2018-11-24 DIAGNOSIS — H0266 Xanthelasma of left eye, unspecified eyelid: Secondary | ICD-10-CM | POA: Diagnosis not present

## 2018-11-24 DIAGNOSIS — I10 Essential (primary) hypertension: Secondary | ICD-10-CM | POA: Diagnosis not present

## 2018-11-24 DIAGNOSIS — Z716 Tobacco abuse counseling: Secondary | ICD-10-CM | POA: Diagnosis not present

## 2018-11-24 DIAGNOSIS — Z8673 Personal history of transient ischemic attack (TIA), and cerebral infarction without residual deficits: Secondary | ICD-10-CM | POA: Diagnosis not present

## 2018-12-15 DIAGNOSIS — M545 Low back pain: Secondary | ICD-10-CM | POA: Diagnosis not present

## 2018-12-15 DIAGNOSIS — I10 Essential (primary) hypertension: Secondary | ICD-10-CM | POA: Diagnosis not present

## 2018-12-15 DIAGNOSIS — E785 Hyperlipidemia, unspecified: Secondary | ICD-10-CM | POA: Diagnosis not present

## 2018-12-15 DIAGNOSIS — H0266 Xanthelasma of left eye, unspecified eyelid: Secondary | ICD-10-CM | POA: Diagnosis not present

## 2018-12-22 ENCOUNTER — Other Ambulatory Visit: Payer: Self-pay | Admitting: Family Medicine

## 2018-12-22 ENCOUNTER — Other Ambulatory Visit: Payer: Self-pay

## 2018-12-22 ENCOUNTER — Ambulatory Visit
Admission: RE | Admit: 2018-12-22 | Discharge: 2018-12-22 | Disposition: A | Discharge status: Home / Self Care | Payer: PPO | Source: Ambulatory Visit | Attending: Family Medicine | Admitting: Family Medicine

## 2018-12-22 DIAGNOSIS — M545 Low back pain, unspecified: Secondary | ICD-10-CM

## 2018-12-22 DIAGNOSIS — M5137 Other intervertebral disc degeneration, lumbosacral region: Secondary | ICD-10-CM | POA: Diagnosis not present

## 2018-12-22 DIAGNOSIS — M47817 Spondylosis without myelopathy or radiculopathy, lumbosacral region: Secondary | ICD-10-CM | POA: Diagnosis not present

## 2018-12-24 DIAGNOSIS — D126 Benign neoplasm of colon, unspecified: Secondary | ICD-10-CM | POA: Diagnosis not present

## 2019-02-08 DIAGNOSIS — Z01818 Encounter for other preprocedural examination: Secondary | ICD-10-CM | POA: Diagnosis not present

## 2019-02-08 DIAGNOSIS — D492 Neoplasm of unspecified behavior of bone, soft tissue, and skin: Secondary | ICD-10-CM | POA: Diagnosis not present

## 2019-02-12 DIAGNOSIS — E786 Lipoprotein deficiency: Secondary | ICD-10-CM | POA: Diagnosis not present

## 2019-02-12 DIAGNOSIS — I10 Essential (primary) hypertension: Secondary | ICD-10-CM | POA: Diagnosis not present

## 2019-02-12 DIAGNOSIS — E785 Hyperlipidemia, unspecified: Secondary | ICD-10-CM | POA: Diagnosis not present

## 2019-03-04 DIAGNOSIS — H5789 Other specified disorders of eye and adnexa: Secondary | ICD-10-CM | POA: Diagnosis not present

## 2019-03-04 DIAGNOSIS — D492 Neoplasm of unspecified behavior of bone, soft tissue, and skin: Secondary | ICD-10-CM | POA: Diagnosis not present

## 2019-03-04 DIAGNOSIS — L720 Epidermal cyst: Secondary | ICD-10-CM | POA: Diagnosis not present

## 2019-03-17 DIAGNOSIS — I10 Essential (primary) hypertension: Secondary | ICD-10-CM | POA: Diagnosis not present

## 2019-06-15 ENCOUNTER — Telehealth: Payer: Self-pay | Admitting: *Deleted

## 2019-06-15 DIAGNOSIS — I10 Essential (primary) hypertension: Secondary | ICD-10-CM | POA: Diagnosis not present

## 2019-06-15 DIAGNOSIS — Z8673 Personal history of transient ischemic attack (TIA), and cerebral infarction without residual deficits: Secondary | ICD-10-CM | POA: Diagnosis not present

## 2019-06-15 DIAGNOSIS — Z01818 Encounter for other preprocedural examination: Secondary | ICD-10-CM | POA: Diagnosis not present

## 2019-06-15 DIAGNOSIS — Z716 Tobacco abuse counseling: Secondary | ICD-10-CM | POA: Diagnosis not present

## 2019-06-15 DIAGNOSIS — Z7189 Other specified counseling: Secondary | ICD-10-CM | POA: Diagnosis not present

## 2019-06-15 DIAGNOSIS — F17200 Nicotine dependence, unspecified, uncomplicated: Secondary | ICD-10-CM | POA: Diagnosis not present

## 2019-06-15 DIAGNOSIS — H2512 Age-related nuclear cataract, left eye: Secondary | ICD-10-CM | POA: Diagnosis not present

## 2019-06-15 NOTE — Telephone Encounter (Signed)
Assisted pt with covid vaccine wait list.

## 2019-07-06 DIAGNOSIS — H25813 Combined forms of age-related cataract, bilateral: Secondary | ICD-10-CM | POA: Diagnosis not present

## 2019-07-06 DIAGNOSIS — H5212 Myopia, left eye: Secondary | ICD-10-CM | POA: Diagnosis not present

## 2019-07-06 DIAGNOSIS — H524 Presbyopia: Secondary | ICD-10-CM | POA: Diagnosis not present

## 2019-07-14 DIAGNOSIS — H2512 Age-related nuclear cataract, left eye: Secondary | ICD-10-CM | POA: Diagnosis not present

## 2019-07-14 DIAGNOSIS — H25012 Cortical age-related cataract, left eye: Secondary | ICD-10-CM | POA: Diagnosis not present

## 2019-07-15 DIAGNOSIS — H4302 Vitreous prolapse, left eye: Secondary | ICD-10-CM | POA: Diagnosis not present

## 2019-07-15 DIAGNOSIS — H59022 Cataract (lens) fragments in eye following cataract surgery, left eye: Secondary | ICD-10-CM | POA: Diagnosis not present

## 2019-07-15 DIAGNOSIS — T8529XA Other mechanical complication of intraocular lens, initial encounter: Secondary | ICD-10-CM | POA: Diagnosis not present

## 2019-07-15 DIAGNOSIS — H2701 Aphakia, right eye: Secondary | ICD-10-CM | POA: Diagnosis not present

## 2019-07-15 DIAGNOSIS — T8522XA Displacement of intraocular lens, initial encounter: Secondary | ICD-10-CM | POA: Diagnosis not present

## 2019-07-21 DIAGNOSIS — T8522XA Displacement of intraocular lens, initial encounter: Secondary | ICD-10-CM | POA: Diagnosis not present

## 2019-07-21 DIAGNOSIS — H2702 Aphakia, left eye: Secondary | ICD-10-CM | POA: Diagnosis not present

## 2019-07-21 DIAGNOSIS — H59022 Cataract (lens) fragments in eye following cataract surgery, left eye: Secondary | ICD-10-CM | POA: Diagnosis not present

## 2019-07-21 DIAGNOSIS — H4302 Vitreous prolapse, left eye: Secondary | ICD-10-CM | POA: Diagnosis not present

## 2019-07-21 DIAGNOSIS — H2512 Age-related nuclear cataract, left eye: Secondary | ICD-10-CM | POA: Diagnosis not present

## 2019-07-22 ENCOUNTER — Ambulatory Visit: Payer: PPO

## 2019-07-23 ENCOUNTER — Ambulatory Visit: Payer: PPO | Attending: Internal Medicine

## 2019-07-23 DIAGNOSIS — Z23 Encounter for immunization: Secondary | ICD-10-CM

## 2019-07-23 NOTE — Progress Notes (Signed)
   Covid-19 Vaccination Clinic  Name:  Dennis Harmon    MRN: [id number] DOB: Apr 08, 1951  07/23/2019  [name] was observed post Covid-19 immunization for 15 minutes without incident. He was provided with Vaccine Information Sheet and instruction to access the V-Safe system.   [name] was instructed to call 911 with any severe reactions post vaccine: Marland Kitchen Difficulty breathing  . Swelling of face and throat  . A fast heartbeat  . A bad rash all over body  . Dizziness and weakness   Immunizations Administered    Name Date Dose VIS Date Route   Pfizer COVID-19 Vaccine 07/23/2019 11:20 AM 0.3 mL 04/30/2019 Intramuscular   Manufacturer: [place]   Lot: [id number]   [place]: [id number]

## 2019-07-29 DIAGNOSIS — H43822 Vitreomacular adhesion, left eye: Secondary | ICD-10-CM | POA: Diagnosis not present

## 2019-07-29 DIAGNOSIS — Z09 Encounter for follow-up examination after completed treatment for conditions other than malignant neoplasm: Secondary | ICD-10-CM | POA: Diagnosis not present

## 2019-08-18 ENCOUNTER — Ambulatory Visit: Payer: PPO | Attending: Internal Medicine

## 2019-08-18 DIAGNOSIS — Z23 Encounter for immunization: Secondary | ICD-10-CM

## 2019-08-18 NOTE — Progress Notes (Signed)
   Covid-19 Vaccination Clinic  Name:  Dennis Harmon    MRN: [id number] DOB: 1950-06-16  08/18/2019  [name] was observed post Covid-19 immunization for 15 minutes without incident. He was provided with Vaccine Information Sheet and instruction to access the V-Safe system.   [name] was instructed to call 911 with any severe reactions post vaccine: Marland Kitchen Difficulty breathing  . Swelling of face and throat  . A fast heartbeat  . A bad rash all over body  . Dizziness and weakness   Immunizations Administered    Name Date Dose VIS Date Route   Pfizer COVID-19 Vaccine 08/18/2019 11:06 AM 0.3 mL 04/30/2019 Intramuscular   Manufacturer: [place]   Lot: [id number]   [place]: [id number]

## 2019-09-09 ENCOUNTER — Ambulatory Visit (INDEPENDENT_AMBULATORY_CARE_PROVIDER_SITE_OTHER): Payer: PPO | Admitting: Ophthalmology

## 2019-09-09 ENCOUNTER — Other Ambulatory Visit: Payer: Self-pay

## 2019-09-09 ENCOUNTER — Encounter (INDEPENDENT_AMBULATORY_CARE_PROVIDER_SITE_OTHER): Payer: Self-pay | Admitting: Ophthalmology

## 2019-09-09 DIAGNOSIS — H43822 Vitreomacular adhesion, left eye: Secondary | ICD-10-CM

## 2019-09-09 DIAGNOSIS — Z09 Encounter for follow-up examination after completed treatment for conditions other than malignant neoplasm: Secondary | ICD-10-CM | POA: Diagnosis not present

## 2019-09-09 DIAGNOSIS — H4052X2 Glaucoma secondary to other eye disorders, left eye, moderate stage: Secondary | ICD-10-CM | POA: Diagnosis not present

## 2019-09-09 DIAGNOSIS — H59022 Cataract (lens) fragments in eye following cataract surgery, left eye: Secondary | ICD-10-CM | POA: Diagnosis not present

## 2019-09-09 DIAGNOSIS — T8522XA Displacement of intraocular lens, initial encounter: Secondary | ICD-10-CM | POA: Insufficient documentation

## 2019-09-09 DIAGNOSIS — H2511 Age-related nuclear cataract, right eye: Secondary | ICD-10-CM | POA: Diagnosis not present

## 2019-09-09 DIAGNOSIS — T8522XD Displacement of intraocular lens, subsequent encounter: Secondary | ICD-10-CM

## 2019-09-09 HISTORY — DX: Cataract (lens) fragments in eye following cataract surgery, left eye: H59.022

## 2019-09-09 HISTORY — DX: Vitreomacular adhesion, left eye: H43.822

## 2019-09-09 NOTE — Assessment & Plan Note (Signed)
Status post vitrectomy and repositioning of the intraocular lens and use of scleral tunnel.  Slight decentration of the lens temporally nonetheless with excellent visual acuity.  Vision and intraocular pressures are adequate.  We will continue to use topical bromfenac once daily as well as Timoptic once daily left eye

## 2019-09-09 NOTE — Addendum Note (Signed)
Addended by: Deloria Lair A on: 09/09/2019 01:26 PM   Modules accepted: Level of Service

## 2019-09-09 NOTE — Progress Notes (Addendum)
09/09/2019     CHIEF COMPLAINT Patient presents for Post-op Follow-up   HISTORY OF PRESENT ILLNESS: Dennis Harmon is a 69 y.o. male who presents to the clinic today for:   HPI    Post-op Follow-up    In left eye.  Discomfort includes pain.  Vision is stable.          Comments    4 Week POV, 7 Weeks s/p PPV for dislocated IOL OS  Pt reports stable VA OS. Pt c/o occasional ache OS.       Last edited by Rockie Neighbours, [place] on 09/09/2019 10:30 AM. (History)      Referring physician: Jordan Hawks, NP [address] [place],  [place] [address]  HISTORICAL INFORMATION:   Selected notes from the MEDICAL RECORD NUMBER    Lab Results  Component Value Date   HGBA1C 4.9 04/21/2018     CURRENT MEDICATIONS: Current Outpatient Medications (Ophthalmic Drugs)  Medication Sig  . Bromfenac Sodium 0.09 % SOLN Place 1 drop into the left eye daily.  . timolol (TIMOPTIC) 0.5 % ophthalmic solution Place 1 drop into the left eye daily.   No current facility-administered medications for this visit. (Ophthalmic Drugs)   Current Outpatient Medications (Other)  Medication Sig  . amLODipine (NORVASC) 5 MG tablet Take 5 mg by mouth at bedtime.  Marland Kitchen aspirin EC 81 MG EC tablet Take 1 tablet (81 mg total) by mouth daily.  Marland Kitchen atorvastatin (LIPITOR) 40 MG tablet Take 1 tablet (40 mg total) by mouth daily at 6 PM.  . buPROPion (WELLBUTRIN XL) 150 MG 24 hr tablet Take 150 mg by mouth daily.   No current facility-administered medications for this visit. (Other)      REVIEW OF SYSTEMS:    ALLERGIES No Known Allergies  PAST MEDICAL HISTORY Past Medical History:  Diagnosis Date  . Coronary artery disease    NSTEMI 9/12:  cath 01/31/11: EF 60%, Inf HK, mRCA 30-40%, LAD ok, mCFX 50%, OM2 occluded (likely > 24hr with Timi 1 flow).  Medical Tx was recommended and PCI would be deferred for recurrent ischemia.  Echo 01/31/11: mild LVH, EF 55%, AL HK, grade 1 diast dysfxn, PASP 27-31  .  HTN (hypertension)   . Hyperlipidemia   . Stroke ([place])   . Tobacco abuse    Past Surgical History:  Procedure Laterality Date  . CARDIAC CATHETERIZATION  01/31/11  . THUMB SURGERY     RIGHT THUMB LIGAMENT SURGERY    FAMILY HISTORY Family History  Problem Relation Age of Onset  . Stroke Paternal Uncle     SOCIAL HISTORY Social History   Tobacco Use  . Smoking status: Current Every Day Smoker    Packs/day: 0.50    Years: 30.00    Pack years: 15.00    Types: Cigarettes  . Smokeless tobacco: Never Used  Substance Use Topics  . Alcohol use: Yes    Comment: 2 BEERS DAILY  . Drug use: No         OPHTHALMIC EXAM:  Base Eye Exam    Visual Acuity (Snellen - Linear)      Right Left   Dist [place] 20/20 20/30 -2   Dist ph [place]  NI       Tonometry (Tonopen, 10:36 AM)      Right Left   Pressure 15 11       Pupils      Dark Light Shape React APD   Right 4 3  Round Slow None   Left 4 4 Irregular Minimal None       Visual Fields (Counting fingers)      Left Right    Full Full       Extraocular Movement      Right Left    Full Full       Neuro/Psych    Oriented x3: Yes   Mood/Affect: Normal       Dilation    Left eye: 1.0% Mydriacyl, 2.5% Phenylephrine @ 10:36 AM        Slit Lamp and Fundus Exam    External Exam      Right Left   External Normal Normal       Slit Lamp Exam      Right Left   Lids/Lashes Normal Normal   Conjunctiva/Sclera White and quiet White and quiet   Cornea Clear Clear   Anterior Chamber Deep and quiet Deep and quiet   Iris Round and reactive Round and reactive   Lens  Posterior chamber intraocular lens,, decentered slightly temporally.   Anterior Vitreous Normal Normal       Fundus Exam      Right Left   Posterior Vitreous  Vitrectomized   Disc  Normal   C/D Ratio  0.4   Macula  Normal   Vessels  Normal   Periphery  Normal          IMAGING AND PROCEDURES  Imaging and Procedures for @TODAY @             ASSESSMENT/PLAN:  Dislocated intraocular lens Status post vitrectomy and repositioning of the intraocular lens and use of scleral tunnel.  Slight decentration of the lens temporally nonetheless with excellent visual acuity.  Vision and intraocular pressures are adequate.  We will continue to use topical bromfenac once daily as well as Timoptic once daily left eye  Secondary glaucoma due to combination mechanisms, left, moderate stage Glaucoma OS secondary to recent complicated cataract surgery and secondary complex insertion and scleral tunnel placement of intraocular lens OS.  His condition in the left eye has improved on topical Timoptic as well as topical NSAID to quiet the inflammatory component of this combined mechanism glaucoma.  I recommend continued Timoptic use once daily because it has no you have uveitogenic side effects, and topical bromfenac columns ciliary body irritation        ICD-10-CM   1. Secondary glaucoma due to combination mechanisms, left, moderate stage  H40.52X2   2. Vitreomacular adhesion of left eye  H43.822   3. Nuclear sclerotic cataract of right eye  H25.11   4. Retained lens material following cataract surgery of left eye  H59.022   5. Dislocation of intraocular lens, subsequent encounter  T85.22XD   6. Follow-up examination after eye surgery  Z09     1.  Continue bromfenac 1 drop daily to the left eye  2.  Continue Timoptic 0.5% 1 drop daily to the left eye  3.  Follow-up with Dr. Gershon Crane as scheduled  4.  Do not rub the eye  Ophthalmic Meds Ordered this visit:  No orders of the defined types were placed in this encounter.      Return in about 3 months (around 12/09/2019) for OS, OCT, dilate.  There are no Patient Instructions on file for this visit.   Explained the diagnoses, plan, and follow up with the patient and they expressed understanding.  Patient expressed understanding of the importance of proper follow up care.  Clent Demark  Shantoria Ellwood M.D. Diseases & Surgery of the Retina and Vitreous Retina & Diabetic [place] @TODAY @     Abbreviations: M myopia (nearsighted); A astigmatism; H hyperopia (farsighted); P presbyopia; Mrx spectacle prescription;  CTL contact lenses; OD right eye; OS left eye; OU both eyes  XT exotropia; ET esotropia; PEK punctate epithelial keratitis; PEE punctate epithelial erosions; DES dry eye syndrome; MGD meibomian gland dysfunction; ATs artificial tears; PFAT's preservative free artificial tears; [place] nuclear sclerotic cataract; PSC posterior subcapsular cataract; ERM epi-retinal membrane; PVD posterior vitreous detachment; RD retinal detachment; DM diabetes mellitus; DR diabetic retinopathy; NPDR non-proliferative diabetic retinopathy; PDR proliferative diabetic retinopathy; CSME clinically significant macular edema; DME diabetic macular edema; dbh dot blot hemorrhages; CWS cotton wool spot; POAG primary open angle glaucoma; C/D cup-to-disc ratio; HVF humphrey visual field; GVF goldmann visual field; OCT optical coherence tomography; IOP intraocular pressure; BRVO Branch retinal vein occlusion; CRVO central retinal vein occlusion; CRAO central retinal artery occlusion; BRAO branch retinal artery occlusion; RT retinal tear; SB scleral buckle; PPV pars plana vitrectomy; VH Vitreous hemorrhage; PRP panretinal laser photocoagulation; IVK intravitreal kenalog; VMT vitreomacular traction; MH Macular hole;  NVD neovascularization of the disc; NVE neovascularization elsewhere; AREDS age related eye disease study; ARMD age related macular degeneration; POAG primary open angle glaucoma; EBMD epithelial/anterior basement membrane dystrophy; ACIOL anterior chamber intraocular lens; IOL intraocular lens; PCIOL posterior chamber intraocular lens; Phaco/IOL phacoemulsification with intraocular lens placement; [place] photorefractive keratectomy; LASIK laser assisted in situ keratomileusis; HTN hypertension; DM diabetes  mellitus; COPD chronic obstructive pulmonary disease

## 2019-09-09 NOTE — Assessment & Plan Note (Signed)
Glaucoma OS secondary to recent complicated cataract surgery and secondary complex insertion and scleral tunnel placement of intraocular lens OS.  His condition in the left eye has improved on topical Timoptic as well as topical NSAID to quiet the inflammatory component of this combined mechanism glaucoma.  I recommend continued Timoptic use once daily because it has no you have uveitogenic side effects, and topical bromfenac columns ciliary body irritation

## 2019-09-17 DIAGNOSIS — F330 Major depressive disorder, recurrent, mild: Secondary | ICD-10-CM | POA: Diagnosis not present

## 2019-09-17 DIAGNOSIS — E785 Hyperlipidemia, unspecified: Secondary | ICD-10-CM | POA: Diagnosis not present

## 2019-09-17 DIAGNOSIS — I10 Essential (primary) hypertension: Secondary | ICD-10-CM | POA: Diagnosis not present

## 2019-09-27 DIAGNOSIS — I10 Essential (primary) hypertension: Secondary | ICD-10-CM | POA: Diagnosis not present

## 2019-09-27 DIAGNOSIS — F329 Major depressive disorder, single episode, unspecified: Secondary | ICD-10-CM | POA: Diagnosis not present

## 2019-09-27 DIAGNOSIS — Z125 Encounter for screening for malignant neoplasm of prostate: Secondary | ICD-10-CM | POA: Diagnosis not present

## 2019-09-27 DIAGNOSIS — G5793 Unspecified mononeuropathy of bilateral lower limbs: Secondary | ICD-10-CM | POA: Diagnosis not present

## 2019-09-27 DIAGNOSIS — E785 Hyperlipidemia, unspecified: Secondary | ICD-10-CM | POA: Diagnosis not present

## 2019-09-30 DIAGNOSIS — M545 Low back pain: Secondary | ICD-10-CM | POA: Diagnosis not present

## 2019-10-07 DIAGNOSIS — M545 Low back pain: Secondary | ICD-10-CM | POA: Diagnosis not present

## 2019-10-12 DIAGNOSIS — M545 Low back pain: Secondary | ICD-10-CM | POA: Diagnosis not present

## 2019-10-18 DIAGNOSIS — E785 Hyperlipidemia, unspecified: Secondary | ICD-10-CM | POA: Diagnosis not present

## 2019-10-18 DIAGNOSIS — I10 Essential (primary) hypertension: Secondary | ICD-10-CM | POA: Diagnosis not present

## 2019-10-18 DIAGNOSIS — F330 Major depressive disorder, recurrent, mild: Secondary | ICD-10-CM | POA: Diagnosis not present

## 2019-10-28 DIAGNOSIS — I10 Essential (primary) hypertension: Secondary | ICD-10-CM | POA: Diagnosis not present

## 2019-11-11 DIAGNOSIS — M545 Low back pain: Secondary | ICD-10-CM | POA: Diagnosis not present

## 2019-11-11 DIAGNOSIS — M5136 Other intervertebral disc degeneration, lumbar region: Secondary | ICD-10-CM | POA: Diagnosis not present

## 2019-11-17 DIAGNOSIS — I10 Essential (primary) hypertension: Secondary | ICD-10-CM | POA: Diagnosis not present

## 2019-11-17 DIAGNOSIS — E785 Hyperlipidemia, unspecified: Secondary | ICD-10-CM | POA: Diagnosis not present

## 2019-11-17 DIAGNOSIS — F330 Major depressive disorder, recurrent, mild: Secondary | ICD-10-CM | POA: Diagnosis not present

## 2019-11-29 DIAGNOSIS — Z8673 Personal history of transient ischemic attack (TIA), and cerebral infarction without residual deficits: Secondary | ICD-10-CM | POA: Diagnosis not present

## 2019-11-29 DIAGNOSIS — I10 Essential (primary) hypertension: Secondary | ICD-10-CM | POA: Diagnosis not present

## 2019-11-29 DIAGNOSIS — F330 Major depressive disorder, recurrent, mild: Secondary | ICD-10-CM | POA: Diagnosis not present

## 2019-11-29 DIAGNOSIS — Z716 Tobacco abuse counseling: Secondary | ICD-10-CM | POA: Diagnosis not present

## 2019-12-09 ENCOUNTER — Other Ambulatory Visit: Payer: Self-pay

## 2019-12-09 ENCOUNTER — Ambulatory Visit (INDEPENDENT_AMBULATORY_CARE_PROVIDER_SITE_OTHER): Payer: PPO | Admitting: Ophthalmology

## 2019-12-09 ENCOUNTER — Encounter (INDEPENDENT_AMBULATORY_CARE_PROVIDER_SITE_OTHER): Payer: Self-pay | Admitting: Ophthalmology

## 2019-12-09 DIAGNOSIS — T8522XD Displacement of intraocular lens, subsequent encounter: Secondary | ICD-10-CM

## 2019-12-09 DIAGNOSIS — H2511 Age-related nuclear cataract, right eye: Secondary | ICD-10-CM

## 2019-12-09 DIAGNOSIS — H43822 Vitreomacular adhesion, left eye: Secondary | ICD-10-CM | POA: Diagnosis not present

## 2019-12-09 NOTE — Assessment & Plan Note (Signed)
This condition is resolved in the past

## 2019-12-09 NOTE — Assessment & Plan Note (Signed)
OS subluxed intraocular lens temporally yet with good visual acuity I do recommend observe.  I do encourage patient do not mash or rub the eye

## 2019-12-09 NOTE — Assessment & Plan Note (Signed)
Follow-up with general ophthalmology as scheduled Dr. Gershon Crane in the past

## 2019-12-09 NOTE — Progress Notes (Signed)
12/09/2019     CHIEF COMPLAINT Patient presents for Retina Follow Up   HISTORY OF PRESENT ILLNESS: Dennis Harmon is a 69 y.o. male who presents to the clinic today for:   HPI    Retina Follow Up    Patient presents with  Other.  In left eye.  This started 3 months ago.  Severity is mild.  Duration of 3 months.  Since onset it is stable.          Comments    3 Month F/U OS  Pt c/o occasional throbbing OS, but sts Tylenol helps to relieve it. Pt denies changes to [name]. Pt sts he is still using both eye drops as prescribed QD OS.       Last edited by Rockie Neighbours, [place] on 12/09/2019 10:26 AM. (History)      Referring physician: Jordan Hawks, NP [address] [place],  [place] [address]  HISTORICAL INFORMATION:   Selected notes from the MEDICAL RECORD NUMBER    Lab Results  Component Value Date   HGBA1C 4.9 04/21/2018     CURRENT MEDICATIONS: Current Outpatient Medications (Ophthalmic Drugs)  Medication Sig  . Bromfenac Sodium 0.09 % SOLN Place 1 drop into the left eye daily.  . timolol (TIMOPTIC) 0.5 % ophthalmic solution Place 1 drop into the left eye daily.   No current facility-administered medications for this visit. (Ophthalmic Drugs)   Current Outpatient Medications (Other)  Medication Sig  . amLODipine (NORVASC) 5 MG tablet Take 5 mg by mouth at bedtime.  Marland Kitchen aspirin EC 81 MG EC tablet Take 1 tablet (81 mg total) by mouth daily.  Marland Kitchen atorvastatin (LIPITOR) 40 MG tablet Take 1 tablet (40 mg total) by mouth daily at 6 PM.  . buPROPion (WELLBUTRIN XL) 150 MG 24 hr tablet Take 150 mg by mouth daily.   No current facility-administered medications for this visit. (Other)      REVIEW OF SYSTEMS:    ALLERGIES No Known Allergies  PAST MEDICAL HISTORY Past Medical History:  Diagnosis Date  . Coronary artery disease    NSTEMI 9/12:  cath 01/31/11: EF 60%, Inf HK, mRCA 30-40%, LAD ok, mCFX 50%, OM2 occluded (likely > 24hr with Timi 1 flow).   Medical Tx was recommended and PCI would be deferred for recurrent ischemia.  Echo 01/31/11: mild LVH, EF 55%, AL HK, grade 1 diast dysfxn, PASP 27-31  . HTN (hypertension)   . Hyperlipidemia   . Retained lens material following cataract surgery of left eye 09/09/2019  . Stroke ([place])   . Tobacco abuse   . Vitreomacular adhesion of left eye 09/09/2019   Past Surgical History:  Procedure Laterality Date  . CARDIAC CATHETERIZATION  01/31/11  . THUMB SURGERY     RIGHT THUMB LIGAMENT SURGERY    FAMILY HISTORY Family History  Problem Relation Age of Onset  . Stroke Paternal Uncle     SOCIAL HISTORY Social History   Tobacco Use  . Smoking status: Current Every Day Smoker    Packs/day: 0.50    Years: 30.00    Pack years: 15.00    Types: Cigarettes  . Smokeless tobacco: Never Used  Substance Use Topics  . Alcohol use: Yes    Comment: 2 BEERS DAILY  . Drug use: No         OPHTHALMIC EXAM:  Base Eye Exam    Visual Acuity (ETDRS)      Right Left   Dist [place] 20/20  20/40 +2   Dist ph [place]  20/30       Tonometry (Tonopen, 10:30 AM)      Right Left   Pressure 18 18       Pupils      Pupils Dark Light Shape React APD   Right PERRL 4 4 Round Minimal None   Left PERRL 4 4 Round Minimal None       Visual Fields (Counting fingers)      Left Right    Full Full       Extraocular Movement      Right Left    Full Full       Neuro/Psych    Oriented x3: Yes   Mood/Affect: Normal       Dilation    Left eye: 1.0% Mydriacyl, 2.5% Phenylephrine @ 10:31 AM        Slit Lamp and Fundus Exam    External Exam      Right Left   External Normal Normal       Slit Lamp Exam      Right Left   Lids/Lashes Normal Normal   Conjunctiva/Sclera White and quiet White and quiet   Cornea Clear Clear   Anterior Chamber Deep and quiet Deep and quiet   Iris Round and reactive Round and reactive   Lens 3+ Nuclear sclerosis Posterior chamber intraocular lens,, partial decentered  slightly temporally.   Anterior Vitreous Normal Normal       Fundus Exam      Right Left   Posterior Vitreous  Vitrectomized   Disc  Normal   C/D Ratio  0.4   Macula  Normal   Vessels  Normal   Periphery  Normal          IMAGING AND PROCEDURES  Imaging and Procedures for 12/28/19  OCT, Retina - OU - Both Eyes       Right Eye Quality was good. Scan locations included subfoveal. Central Foveal Thickness: 309. Findings include no SRF, retinal drusen , no IRF, normal foveal contour.   Left Eye Quality was good. Scan locations included subfoveal. Central Foveal Thickness: 313. Findings include no SRF, retinal drusen , no IRF, normal foveal contour.   Notes PVD  OS BY OCT,,, normal contours,,                  ASSESSMENT/PLAN:  Nuclear sclerotic cataract of right eye Follow-up with general ophthalmology as scheduled Dr. Gershon Crane in the past  Vitreomacular adhesion of left eye This condition is resolved in the past  Dislocated intraocular lens OS subluxed intraocular lens temporally yet with good visual acuity I do recommend observe.  I do encourage patient do not mash or rub the eye      ICD-10-CM   1. Vitreomacular adhesion of left eye  H43.822 OCT, Retina - OU - Both Eyes  2. Nuclear sclerotic cataract of right eye  H25.11   3. Dislocation of intraocular lens, subsequent encounter  T85.22XD     1. Patient to continue drops; Bromfenac QD & Timolol QD OS  2.  3.  Ophthalmic Meds Ordered this visit:  No orders of the defined types were placed in this encounter.      Return in about 6 months (around 06/10/2020) for DILATE OU, OCT.  There are no Patient Instructions on file for this visit.   Explained the diagnoses, plan, and follow up with the patient and they expressed understanding.  Patient expressed understanding of the importance  of proper follow up care.   Clent Demark Giannis Corpuz M.D. Diseases & Surgery of the Retina and Vitreous Retina & Diabetic [place] 12/28/19     Abbreviations: M myopia (nearsighted); A astigmatism; H hyperopia (farsighted); P presbyopia; Mrx spectacle prescription;  CTL contact lenses; OD right eye; OS left eye; OU both eyes  XT exotropia; ET esotropia; PEK punctate epithelial keratitis; PEE punctate epithelial erosions; DES dry eye syndrome; MGD meibomian gland dysfunction; ATs artificial tears; PFAT's preservative free artificial tears; [place] nuclear sclerotic cataract; PSC posterior subcapsular cataract; ERM epi-retinal membrane; PVD posterior vitreous detachment; RD retinal detachment; DM diabetes mellitus; DR diabetic retinopathy; NPDR non-proliferative diabetic retinopathy; PDR proliferative diabetic retinopathy; CSME clinically significant macular edema; DME diabetic macular edema; dbh dot blot hemorrhages; CWS cotton wool spot; POAG primary open angle glaucoma; C/D cup-to-disc ratio; HVF humphrey visual field; GVF goldmann visual field; OCT optical coherence tomography; IOP intraocular pressure; BRVO Branch retinal vein occlusion; CRVO central retinal vein occlusion; CRAO central retinal artery occlusion; BRAO branch retinal artery occlusion; RT retinal tear; SB scleral buckle; PPV pars plana vitrectomy; VH Vitreous hemorrhage; PRP panretinal laser photocoagulation; IVK intravitreal kenalog; VMT vitreomacular traction; MH Macular hole;  NVD neovascularization of the disc; NVE neovascularization elsewhere; AREDS age related eye disease study; ARMD age related macular degeneration; POAG primary open angle glaucoma; EBMD epithelial/anterior basement membrane dystrophy; ACIOL anterior chamber intraocular lens; IOL intraocular lens; PCIOL posterior chamber intraocular lens; Phaco/IOL phacoemulsification with intraocular lens placement; [place] photorefractive keratectomy; LASIK laser assisted in situ keratomileusis; HTN hypertension; DM diabetes mellitus; COPD chronic obstructive pulmonary disease
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
## 2019-12-17 DIAGNOSIS — E785 Hyperlipidemia, unspecified: Secondary | ICD-10-CM | POA: Diagnosis not present

## 2019-12-17 DIAGNOSIS — F330 Major depressive disorder, recurrent, mild: Secondary | ICD-10-CM | POA: Diagnosis not present

## 2019-12-17 DIAGNOSIS — I10 Essential (primary) hypertension: Secondary | ICD-10-CM | POA: Diagnosis not present

## 2020-01-10 DIAGNOSIS — F17200 Nicotine dependence, unspecified, uncomplicated: Secondary | ICD-10-CM | POA: Diagnosis not present

## 2020-01-10 DIAGNOSIS — I10 Essential (primary) hypertension: Secondary | ICD-10-CM | POA: Diagnosis not present

## 2020-01-10 DIAGNOSIS — F330 Major depressive disorder, recurrent, mild: Secondary | ICD-10-CM | POA: Diagnosis not present

## 2020-01-10 DIAGNOSIS — Z7189 Other specified counseling: Secondary | ICD-10-CM | POA: Diagnosis not present

## 2020-01-10 DIAGNOSIS — Z716 Tobacco abuse counseling: Secondary | ICD-10-CM | POA: Diagnosis not present

## 2020-01-18 DIAGNOSIS — I10 Essential (primary) hypertension: Secondary | ICD-10-CM | POA: Diagnosis not present

## 2020-01-18 DIAGNOSIS — E785 Hyperlipidemia, unspecified: Secondary | ICD-10-CM | POA: Diagnosis not present

## 2020-01-18 DIAGNOSIS — F330 Major depressive disorder, recurrent, mild: Secondary | ICD-10-CM | POA: Diagnosis not present

## 2020-02-17 DIAGNOSIS — I10 Essential (primary) hypertension: Secondary | ICD-10-CM | POA: Diagnosis not present

## 2020-02-17 DIAGNOSIS — E785 Hyperlipidemia, unspecified: Secondary | ICD-10-CM | POA: Diagnosis not present

## 2020-02-17 DIAGNOSIS — F330 Major depressive disorder, recurrent, mild: Secondary | ICD-10-CM | POA: Diagnosis not present

## 2020-03-18 DIAGNOSIS — F330 Major depressive disorder, recurrent, mild: Secondary | ICD-10-CM | POA: Diagnosis not present

## 2020-03-18 DIAGNOSIS — E785 Hyperlipidemia, unspecified: Secondary | ICD-10-CM | POA: Diagnosis not present

## 2020-03-18 DIAGNOSIS — I10 Essential (primary) hypertension: Secondary | ICD-10-CM | POA: Diagnosis not present

## 2020-04-11 DIAGNOSIS — I10 Essential (primary) hypertension: Secondary | ICD-10-CM | POA: Diagnosis not present

## 2020-04-11 DIAGNOSIS — Z7189 Other specified counseling: Secondary | ICD-10-CM | POA: Diagnosis not present

## 2020-04-11 DIAGNOSIS — E785 Hyperlipidemia, unspecified: Secondary | ICD-10-CM | POA: Diagnosis not present

## 2020-04-18 DIAGNOSIS — F330 Major depressive disorder, recurrent, mild: Secondary | ICD-10-CM | POA: Diagnosis not present

## 2020-04-18 DIAGNOSIS — E785 Hyperlipidemia, unspecified: Secondary | ICD-10-CM | POA: Diagnosis not present

## 2020-04-18 DIAGNOSIS — I10 Essential (primary) hypertension: Secondary | ICD-10-CM | POA: Diagnosis not present

## 2020-04-23 ENCOUNTER — Other Ambulatory Visit (INDEPENDENT_AMBULATORY_CARE_PROVIDER_SITE_OTHER): Payer: Self-pay | Admitting: Ophthalmology

## 2020-04-24 DIAGNOSIS — F330 Major depressive disorder, recurrent, mild: Secondary | ICD-10-CM | POA: Diagnosis not present

## 2020-04-24 DIAGNOSIS — Z7189 Other specified counseling: Secondary | ICD-10-CM | POA: Diagnosis not present

## 2020-04-24 DIAGNOSIS — F17200 Nicotine dependence, unspecified, uncomplicated: Secondary | ICD-10-CM | POA: Diagnosis not present

## 2020-04-24 DIAGNOSIS — I10 Essential (primary) hypertension: Secondary | ICD-10-CM | POA: Diagnosis not present

## 2020-05-19 DIAGNOSIS — E785 Hyperlipidemia, unspecified: Secondary | ICD-10-CM | POA: Diagnosis not present

## 2020-05-19 DIAGNOSIS — I10 Essential (primary) hypertension: Secondary | ICD-10-CM | POA: Diagnosis not present

## 2020-05-19 DIAGNOSIS — F330 Major depressive disorder, recurrent, mild: Secondary | ICD-10-CM | POA: Diagnosis not present

## 2020-06-12 ENCOUNTER — Encounter (INDEPENDENT_AMBULATORY_CARE_PROVIDER_SITE_OTHER): Payer: Medicare Other | Admitting: Ophthalmology

## 2020-07-17 DIAGNOSIS — F330 Major depressive disorder, recurrent, mild: Secondary | ICD-10-CM | POA: Diagnosis not present

## 2020-07-17 DIAGNOSIS — I10 Essential (primary) hypertension: Secondary | ICD-10-CM | POA: Diagnosis not present

## 2020-07-17 DIAGNOSIS — E785 Hyperlipidemia, unspecified: Secondary | ICD-10-CM | POA: Diagnosis not present

## 2020-07-19 DIAGNOSIS — L218 Other seborrheic dermatitis: Secondary | ICD-10-CM | POA: Diagnosis not present

## 2020-07-19 DIAGNOSIS — L810 Postinflammatory hyperpigmentation: Secondary | ICD-10-CM | POA: Diagnosis not present

## 2020-08-17 DIAGNOSIS — E785 Hyperlipidemia, unspecified: Secondary | ICD-10-CM | POA: Diagnosis not present

## 2020-08-17 DIAGNOSIS — I10 Essential (primary) hypertension: Secondary | ICD-10-CM | POA: Diagnosis not present

## 2020-08-17 DIAGNOSIS — F330 Major depressive disorder, recurrent, mild: Secondary | ICD-10-CM | POA: Diagnosis not present

## 2020-08-23 DIAGNOSIS — L218 Other seborrheic dermatitis: Secondary | ICD-10-CM | POA: Diagnosis not present

## 2020-09-14 ENCOUNTER — Ambulatory Visit: Payer: Medicare Other | Admitting: Podiatry

## 2020-09-14 ENCOUNTER — Encounter: Payer: Self-pay | Admitting: Podiatry

## 2020-09-14 ENCOUNTER — Ambulatory Visit: Payer: Medicare Other

## 2020-09-14 ENCOUNTER — Other Ambulatory Visit: Payer: Self-pay

## 2020-09-14 DIAGNOSIS — L814 Other melanin hyperpigmentation: Secondary | ICD-10-CM | POA: Diagnosis not present

## 2020-09-14 DIAGNOSIS — L608 Other nail disorders: Secondary | ICD-10-CM | POA: Diagnosis not present

## 2020-09-14 DIAGNOSIS — D2372 Other benign neoplasm of skin of left lower limb, including hip: Secondary | ICD-10-CM | POA: Diagnosis not present

## 2020-09-14 DIAGNOSIS — M778 Other enthesopathies, not elsewhere classified: Secondary | ICD-10-CM

## 2020-09-14 DIAGNOSIS — L603 Nail dystrophy: Secondary | ICD-10-CM | POA: Diagnosis not present

## 2020-09-14 NOTE — Progress Notes (Signed)
Subjective:  Patient ID: Dennis Harmon, male    DOB: [birth year],  MRN: [id number] HPI Chief Complaint  Patient presents with  . Foot Pain    Plantar/lateral left - small, callused, dark area x 1 year, tried pulling it out, but comes right back  . Nail Problem    Hallux nail left - thick and discolored   . New Patient (Initial Visit)    70 y.o. male presents with the above complaint.   ROS: Denies fever chills nausea vomiting muscle aches pains calf pain back pain chest pain shortness of breath.  Past Medical History:  Diagnosis Date  . Coronary artery disease    NSTEMI 9/12:  cath 01/31/11: EF 60%, Inf HK, mRCA 30-40%, LAD ok, mCFX 50%, OM2 occluded (likely > 24hr with Timi 1 flow).  Medical Tx was recommended and PCI would be deferred for recurrent ischemia.  Echo 01/31/11: mild LVH, EF 55%, AL HK, grade 1 diast dysfxn, PASP 27-31  . HTN (hypertension)   . Hyperlipidemia   . Retained lens material following cataract surgery of left eye 09/09/2019  . Stroke ([place])   . Tobacco abuse   . Vitreomacular adhesion of left eye 09/09/2019   Past Surgical History:  Procedure Laterality Date  . CARDIAC CATHETERIZATION  01/31/11  . THUMB SURGERY     RIGHT THUMB LIGAMENT SURGERY    Current Outpatient Medications:  .  amLODipine-valsartan (EXFORGE) 10-320 MG tablet, Take 1 tablet by mouth at bedtime., Disp: , Rfl:  .  aspirin EC 81 MG EC tablet, Take 1 tablet (81 mg total) by mouth daily., Disp: 30 tablet, Rfl: 0 .  atorvastatin (LIPITOR) 40 MG tablet, Take 1 tablet (40 mg total) by mouth daily at 6 PM., Disp: 30 tablet, Rfl: 0 .  betamethasone dipropionate (DIPROLENE) 0.05 % ointment, APPLY A SMALL AMOUNT TO AFFECTED AREA ONCE A DAY AS NEEDED, Disp: , Rfl:  .  nicotine (NICODERM CQ - DOSED IN MG/24 HOURS) 21 mg/24hr patch, 21 mg daily., Disp: , Rfl:  .  timolol (TIMOPTIC) 0.5 % ophthalmic solution, Place 1 drop into the left eye daily., Disp: , Rfl:   No Known Allergies Review of  Systems Objective:  There were no vitals filed for this visit.  General: Well developed, nourished, in no acute distress, alert and oriented x3   Dermatological: Skin is warm, dry and supple bilateral. Nails x 10 are well maintained; remaining integument appears unremarkable at this time. There are no open sores, no preulcerative lesions, no rash or signs of infection present.  Painful thick yellow dystrophic possibly mycotic hallux nail left also demonstrates a poor keratoma that is painful subfifth metatarsal diaphyseal region left.  No signs of infection.  Vascular: Dorsalis Pedis artery and Posterior Tibial artery pedal pulses are 2/4 bilateral with immedate capillary fill time. Pedal hair growth present. No varicosities and no lower extremity edema present bilateral.   Neruologic: Grossly intact via light touch bilateral. Vibratory intact via tuning fork bilateral. Protective threshold with Semmes Wienstein monofilament intact to all pedal sites bilateral. Patellar and Achilles deep tendon reflexes 2+ bilateral. No Babinski or clonus noted bilateral.   Musculoskeletal: No gross boney pedal deformities bilateral. No pain, crepitus, or limitation noted with foot and ankle range of motion bilateral. Muscular strength 5/5 in all groups tested bilateral.  Gait: Unassisted, Nonantalgic.    Radiographs:  None taken  Assessment & Plan:   Assessment: Benign skin lesion.  Pain in limb secondary to nail dystrophy  Plan: Samples  of the skin and nail were taken today for pathologic evaluation.  Chemical destruction benign skin lesion left foot.  Follow-up with him in 1 month     Gisela Lea T. [place], [place]

## 2020-09-16 DIAGNOSIS — I10 Essential (primary) hypertension: Secondary | ICD-10-CM | POA: Diagnosis not present

## 2020-09-16 DIAGNOSIS — F330 Major depressive disorder, recurrent, mild: Secondary | ICD-10-CM | POA: Diagnosis not present

## 2020-09-16 DIAGNOSIS — E785 Hyperlipidemia, unspecified: Secondary | ICD-10-CM | POA: Diagnosis not present

## 2020-10-12 ENCOUNTER — Other Ambulatory Visit: Payer: Self-pay

## 2020-10-12 ENCOUNTER — Ambulatory Visit: Payer: Medicare Other | Admitting: Podiatry

## 2020-10-12 ENCOUNTER — Encounter: Payer: Self-pay | Admitting: Podiatry

## 2020-10-12 DIAGNOSIS — M79676 Pain in unspecified toe(s): Secondary | ICD-10-CM

## 2020-10-12 DIAGNOSIS — B351 Tinea unguium: Secondary | ICD-10-CM

## 2020-10-15 NOTE — Progress Notes (Signed)
[name] for follow-up of his pathology report.  States that the lesion is getting better.  He is really no problems with that at all.  Objective: Vital signs are stable alert oriented x3 there is no erythema edema/drainage odor Salter-Harris porokeratotic lesion is noted to the plantar lateral aspect of the left foot which it was easily enucleated today.  Pathology result does not demonstrate onychomycosis mycosis other keratinization is of the subungual nail is noted.  Assessment: Resolving porokeratosis.  Nail dystrophy.  Plan: Follow-up with me on an as-needed basis.

## 2020-10-16 ENCOUNTER — Other Ambulatory Visit (INDEPENDENT_AMBULATORY_CARE_PROVIDER_SITE_OTHER): Payer: Self-pay | Admitting: Ophthalmology

## 2020-11-16 DIAGNOSIS — I10 Essential (primary) hypertension: Secondary | ICD-10-CM | POA: Diagnosis not present

## 2020-11-16 DIAGNOSIS — F330 Major depressive disorder, recurrent, mild: Secondary | ICD-10-CM | POA: Diagnosis not present

## 2020-11-16 DIAGNOSIS — E785 Hyperlipidemia, unspecified: Secondary | ICD-10-CM | POA: Diagnosis not present

## 2021-01-16 DIAGNOSIS — F5221 Male erectile disorder: Secondary | ICD-10-CM | POA: Diagnosis not present

## 2021-01-16 DIAGNOSIS — N529 Male erectile dysfunction, unspecified: Secondary | ICD-10-CM | POA: Diagnosis not present

## 2021-01-16 DIAGNOSIS — I69354 Hemiplegia and hemiparesis following cerebral infarction affecting left non-dominant side: Secondary | ICD-10-CM | POA: Diagnosis not present

## 2021-01-16 DIAGNOSIS — I10 Essential (primary) hypertension: Secondary | ICD-10-CM | POA: Diagnosis not present

## 2021-01-16 DIAGNOSIS — E785 Hyperlipidemia, unspecified: Secondary | ICD-10-CM | POA: Diagnosis not present

## 2021-01-16 DIAGNOSIS — Z720 Tobacco use: Secondary | ICD-10-CM | POA: Diagnosis not present

## 2021-02-16 DIAGNOSIS — F330 Major depressive disorder, recurrent, mild: Secondary | ICD-10-CM | POA: Diagnosis not present

## 2021-02-16 DIAGNOSIS — I10 Essential (primary) hypertension: Secondary | ICD-10-CM | POA: Diagnosis not present

## 2021-02-16 DIAGNOSIS — E785 Hyperlipidemia, unspecified: Secondary | ICD-10-CM | POA: Diagnosis not present

## 2021-03-13 DIAGNOSIS — N400 Enlarged prostate without lower urinary tract symptoms: Secondary | ICD-10-CM | POA: Diagnosis not present

## 2021-03-13 DIAGNOSIS — F5221 Male erectile disorder: Secondary | ICD-10-CM | POA: Diagnosis not present

## 2021-03-13 DIAGNOSIS — Z125 Encounter for screening for malignant neoplasm of prostate: Secondary | ICD-10-CM | POA: Diagnosis not present

## 2021-08-21 ENCOUNTER — Encounter: Payer: Self-pay | Admitting: Podiatry

## 2021-08-21 ENCOUNTER — Ambulatory Visit: Payer: Medicare Other | Admitting: Podiatry

## 2021-08-21 DIAGNOSIS — B351 Tinea unguium: Secondary | ICD-10-CM | POA: Diagnosis not present

## 2021-08-21 DIAGNOSIS — M79676 Pain in unspecified toe(s): Secondary | ICD-10-CM | POA: Diagnosis not present

## 2021-08-21 DIAGNOSIS — D2372 Other benign neoplasm of skin of left lower limb, including hip: Secondary | ICD-10-CM

## 2021-08-21 NOTE — Progress Notes (Signed)
He presents today chief complaint of painful elongated toenails and a painful growth on the plantar aspect of his left foot. ? ?Objective: Pulses are palpable.  There is no erythema edema cellulitis drainage or odor toenails are long thick yellow dystrophic with mycotic with a painful benign skin lesion to the plantar aspect of the left foot along the lateral border. ? ?Assessment: Painful benign skin lesion painful mycotic nails. ? ?Plan plan: Debrided nails 1 through 5 bilaterally debrided painful benign skin lesion manually and then placed Salinocaine under occlusion for the next 3 days to be washed off thoroughly.  He is to keep this dry and clean until that time. ?

## 2021-11-13 ENCOUNTER — Ambulatory Visit: Payer: Medicare Other | Admitting: Podiatry

## 2021-12-11 ENCOUNTER — Ambulatory Visit: Payer: Medicare Other | Admitting: Podiatry

## 2021-12-11 ENCOUNTER — Encounter: Payer: Self-pay | Admitting: Podiatry

## 2021-12-11 DIAGNOSIS — B351 Tinea unguium: Secondary | ICD-10-CM | POA: Diagnosis not present

## 2021-12-11 DIAGNOSIS — D2372 Other benign neoplasm of skin of left lower limb, including hip: Secondary | ICD-10-CM

## 2021-12-11 DIAGNOSIS — M79676 Pain in unspecified toe(s): Secondary | ICD-10-CM

## 2021-12-11 NOTE — Progress Notes (Signed)
He presents today chief complaint of painful toenails and calluses.  Objective: Vital signs are stable he is alert oriented x3.  There is no erythema edema salines drainage odor he has a solitary porokeratotic lesion plantar aspect of the lateral aspect of the left foot with hyperpigmentation.  He does also have thick yellow dystrophic clinical mycotic nails on.  Pulses are palpable.  Assessment: Pain limb secondary onychomycosis benign skin lesion.  Plan: Debridement of benign skin lesion with chemical destruction under occlusion for 3 days.  Also debrided nails 1 through 5 bilateral.

## 2022-06-11 ENCOUNTER — Encounter: Payer: Self-pay | Admitting: Podiatry

## 2022-06-11 ENCOUNTER — Ambulatory Visit: Payer: Medicare Other | Admitting: Podiatry

## 2022-06-11 VITALS — BP 114/58 | HR 80

## 2022-06-11 DIAGNOSIS — D2372 Other benign neoplasm of skin of left lower limb, including hip: Secondary | ICD-10-CM

## 2022-06-11 DIAGNOSIS — M79676 Pain in unspecified toe(s): Secondary | ICD-10-CM

## 2022-06-11 DIAGNOSIS — B351 Tinea unguium: Secondary | ICD-10-CM | POA: Diagnosis not present

## 2022-06-11 NOTE — Progress Notes (Signed)
He presents today chief complaint of painful elongated toenails and calluses plantar aspect of the bilateral foot.  States that he finally quit working at [employer].  Objective: Vital signs stable he is alert oriented x 3 there is no erythema edema salines drainage or odor pulses are strong and palpable.  No open lesions or wounds.  Multiple small benign punctated lesions plantar aspect of the forefoot and midfoot bilaterally.  Otherwise toenails are thick yellow dystrophic onychomycotic no open lesions or wounds are noted.  Assessment: Pain in limb secondary to onychomycosis and benign skin lesions.  Plan: Debrided benign skin lesions debrided porokeratotic lesions and debrided toenails 1 through 5 bilateral.

## 2023-02-25 ENCOUNTER — Other Ambulatory Visit: Payer: Self-pay | Admitting: Family Medicine

## 2023-02-25 DIAGNOSIS — Z720 Tobacco use: Secondary | ICD-10-CM

## 2023-02-25 DIAGNOSIS — F17210 Nicotine dependence, cigarettes, uncomplicated: Secondary | ICD-10-CM

## 2023-02-26 ENCOUNTER — Encounter: Payer: Self-pay | Admitting: Family Medicine

## 2023-02-27 ENCOUNTER — Ambulatory Visit
Admission: RE | Admit: 2023-02-27 | Discharge: 2023-02-27 | Disposition: A | Discharge status: Home / Self Care | Payer: Medicare Other | Source: Ambulatory Visit | Attending: Family Medicine | Admitting: Family Medicine

## 2023-02-27 DIAGNOSIS — F17210 Nicotine dependence, cigarettes, uncomplicated: Secondary | ICD-10-CM

## 2023-02-27 DIAGNOSIS — Z720 Tobacco use: Secondary | ICD-10-CM
